# Patient Record
Sex: Female | Born: 1995 | Race: White | Hispanic: No | Marital: Single | Smoking: Current every day smoker
Health system: Southern US, Community
[De-identification: ages and names within clinical notes are randomized; demographics above are authoritative.]

## PROBLEM LIST (undated history)

## (undated) DIAGNOSIS — E109 Type 1 diabetes mellitus without complications: Secondary | ICD-10-CM

## (undated) DIAGNOSIS — F329 Major depressive disorder, single episode, unspecified: Secondary | ICD-10-CM

## (undated) DIAGNOSIS — Q513 Bicornate uterus: Secondary | ICD-10-CM

## (undated) DIAGNOSIS — M329 Systemic lupus erythematosus, unspecified: Secondary | ICD-10-CM

## (undated) DIAGNOSIS — K589 Irritable bowel syndrome without diarrhea: Secondary | ICD-10-CM

## (undated) DIAGNOSIS — R102 Pelvic and perineal pain: Secondary | ICD-10-CM

## (undated) DIAGNOSIS — IMO0002 Reserved for concepts with insufficient information to code with codable children: Secondary | ICD-10-CM

## (undated) DIAGNOSIS — F32A Depression, unspecified: Secondary | ICD-10-CM

## (undated) DIAGNOSIS — B1920 Unspecified viral hepatitis C without hepatic coma: Secondary | ICD-10-CM

## (undated) DIAGNOSIS — J45909 Unspecified asthma, uncomplicated: Secondary | ICD-10-CM

## (undated) DIAGNOSIS — G8929 Other chronic pain: Secondary | ICD-10-CM

## (undated) HISTORY — PX: TONSILLECTOMY: SUR1361

## (undated) HISTORY — DX: Systemic lupus erythematosus, unspecified: M32.9

## (undated) HISTORY — DX: Type 1 diabetes mellitus without complications: E10.9

## (undated) HISTORY — DX: Reserved for concepts with insufficient information to code with codable children: IMO0002

---
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
## 2013-07-02 ENCOUNTER — Emergency Department (HOSPITAL_COMMUNITY): Payer: Medicaid Other

## 2013-07-02 ENCOUNTER — Encounter (HOSPITAL_COMMUNITY): Payer: Self-pay | Admitting: Emergency Medicine

## 2013-07-02 ENCOUNTER — Emergency Department (HOSPITAL_COMMUNITY)
Admission: EM | Admit: 2013-07-02 | Discharge: 2013-07-02 | Disposition: A | Discharge status: Home / Self Care | Payer: Medicaid Other | Attending: Emergency Medicine | Admitting: Emergency Medicine

## 2013-07-02 DIAGNOSIS — Z791 Long term (current) use of non-steroidal anti-inflammatories (NSAID): Secondary | ICD-10-CM | POA: Insufficient documentation

## 2013-07-02 DIAGNOSIS — L0201 Cutaneous abscess of face: Secondary | ICD-10-CM | POA: Insufficient documentation

## 2013-07-02 DIAGNOSIS — K089 Disorder of teeth and supporting structures, unspecified: Secondary | ICD-10-CM | POA: Insufficient documentation

## 2013-07-02 DIAGNOSIS — H9209 Otalgia, unspecified ear: Secondary | ICD-10-CM | POA: Insufficient documentation

## 2013-07-02 DIAGNOSIS — J45909 Unspecified asthma, uncomplicated: Secondary | ICD-10-CM | POA: Insufficient documentation

## 2013-07-02 DIAGNOSIS — Q51820 Cervical duplication: Secondary | ICD-10-CM | POA: Insufficient documentation

## 2013-07-02 DIAGNOSIS — Z792 Long term (current) use of antibiotics: Secondary | ICD-10-CM | POA: Insufficient documentation

## 2013-07-02 DIAGNOSIS — F17200 Nicotine dependence, unspecified, uncomplicated: Secondary | ICD-10-CM | POA: Insufficient documentation

## 2013-07-02 DIAGNOSIS — Z880 Allergy status to penicillin: Secondary | ICD-10-CM | POA: Insufficient documentation

## 2013-07-02 DIAGNOSIS — Z8659 Personal history of other mental and behavioral disorders: Secondary | ICD-10-CM | POA: Insufficient documentation

## 2013-07-02 DIAGNOSIS — L03211 Cellulitis of face: Secondary | ICD-10-CM

## 2013-07-02 LAB — CBC WITH DIFFERENTIAL/PLATELET
Basophils Absolute: 0.1 10*3/uL (ref 0.0–0.1)
Basophils Relative: 1 % (ref 0–1)
EOS ABS: 0.6 10*3/uL (ref 0.0–0.7)
Eosinophils Relative: 5 % (ref 0–5)
HEMATOCRIT: 41.6 % (ref 36.0–46.0)
Hemoglobin: 14.3 g/dL (ref 12.0–15.0)
LYMPHS ABS: 3 10*3/uL (ref 0.7–4.0)
LYMPHS PCT: 24 % (ref 12–46)
MCH: 29.2 pg (ref 26.0–34.0)
MCHC: 34.4 g/dL (ref 30.0–36.0)
MCV: 84.9 fL (ref 78.0–100.0)
MONO ABS: 0.8 10*3/uL (ref 0.1–1.0)
Monocytes Relative: 7 % (ref 3–12)
Neutro Abs: 8 10*3/uL — ABNORMAL HIGH (ref 1.7–7.7)
Neutrophils Relative %: 63 % (ref 43–77)
PLATELETS: 218 10*3/uL (ref 150–400)
RBC: 4.9 MIL/uL (ref 3.87–5.11)
RDW: 13.2 % (ref 11.5–15.5)
WBC: 12.5 10*3/uL — AB (ref 4.0–10.5)

## 2013-07-02 LAB — BASIC METABOLIC PANEL
BUN: 6 mg/dL (ref 6–23)
CALCIUM: 9.1 mg/dL (ref 8.4–10.5)
CO2: 29 mEq/L (ref 19–32)
Chloride: 98 mEq/L (ref 96–112)
Creatinine, Ser: 0.73 mg/dL (ref 0.50–1.10)
Glucose, Bld: 102 mg/dL — ABNORMAL HIGH (ref 70–99)
POTASSIUM: 4.1 meq/L (ref 3.7–5.3)
SODIUM: 138 meq/L (ref 137–147)

## 2013-07-02 MED ORDER — IOHEXOL 300 MG/ML  SOLN
80.0000 mL | Freq: Once | INTRAMUSCULAR | Status: AC | PRN
  Administered 2013-07-02: 80 mL via INTRAVENOUS

## 2013-07-02 MED ORDER — MORPHINE SULFATE 4 MG/ML IJ SOLN
4.0000 mg | Freq: Once | INTRAMUSCULAR | Status: AC
  Administered 2013-07-02: 4 mg via INTRAVENOUS
  Filled 2013-07-02: qty 1

## 2013-07-02 MED ORDER — SODIUM CHLORIDE 0.9 % IJ SOLN
INTRAMUSCULAR | Status: AC
  Filled 2013-07-02: qty 750

## 2013-07-02 MED ORDER — CLINDAMYCIN PHOSPHATE 600 MG/50ML IV SOLN
600.0000 mg | Freq: Once | INTRAVENOUS | Status: AC
  Administered 2013-07-02: 600 mg via INTRAVENOUS
  Filled 2013-07-02: qty 50

## 2013-07-02 MED ORDER — OXYCODONE-ACETAMINOPHEN 5-325 MG PO TABS: 1.0000 | ORAL_TABLET | ORAL | Status: DC | PRN

## 2013-07-02 MED ORDER — CLINDAMYCIN HCL 300 MG PO CAPS: 300.0000 mg | ORAL_CAPSULE | Freq: Four times a day (QID) | ORAL | Status: DC

## 2013-07-02 NOTE — ED Notes (Signed)
Pt c/o pain and facial swelling that started three days ago, [place] had "numbing" shot and was given PCN 500 mg BID, pt states that the swelling has become worse, pain with swallowing, eating. Pt has swelling noted from right eye to bottom of right face area.

## 2013-07-02 NOTE — Discharge Instructions (Signed)
Cellulitis °Cellulitis is an infection of the skin and the tissue under the skin. The infected area is usually red and tender. This happens most often in the arms and lower legs. °HOME CARE  °· Take your antibiotic medicine as told. Finish the medicine even if you start to feel better. °· Keep the infected arm or leg raised (elevated). °· Put a warm cloth on the area up to 4 times per day. °· Only take medicines as told by your doctor. °· Keep all doctor visits as told. °GET HELP RIGHT AWAY IF:  °· You have a fever. °· You feel very sleepy. °· You throw up (vomit) or have watery poop (diarrhea). °· You feel sick and have muscle aches and pains. °· You see red streaks on the skin coming from the infected area. °· Your red area gets bigger or turns a dark color. °· Your bone or joint under the infected area is painful after the skin heals. °· Your infection comes back in the same area or different area. °· You have a puffy (swollen) bump in the infected area. °· You have new symptoms. °MAKE SURE YOU:  °· Understand these instructions. °· Will watch your condition. °· Will get help right away if you are not doing well or get worse. °Document Released: 06/08/2007 Document Revised: 06/21/2011 Document Reviewed: 03/07/2011 °[employer]® Patient Information ©2015 [employer]. This information is not intended to replace advice given to you by your health care provider. Make sure you discuss any questions you have with your health care provider. ° °

## 2013-07-02 NOTE — ED Notes (Signed)
Pt requesting to see Dr Bebe ShaggyWickline again, Dr Bebe ShaggyWickline notified,

## 2013-07-02 NOTE — ED Provider Notes (Signed)
CSN: [id number]     Arrival date & time 07/02/13  1059 History  This chart was scribed for Linda Gaskinsonald W Aden Youngman, MD by Julian HyMorgan Graham, ED Scribe. The patient was seen in [facility]. The patient's care was started at 12:18 PM.    Chief Complaint  Patient presents with  . Facial Swelling   Patient is a 18 y.o. female presenting with general illness. The history is provided by the patient. No language interpreter was used.  Illness Location:  Right side facial swelling Severity:  Moderate Onset quality:  Sudden Duration:  2 days Timing:  Constant Progression:  Worsening Chronicity:  New Context:  Was seen prior to today and given penicillin Ineffective treatments:  Penicillin Associated symptoms: ear pain (right side) and fever   Associated symptoms: no vomiting   Associated symptoms comment:  Pt states throat and left nasal passage swelling.   HPI Comments: Linda Spence is a 18 y.o. female who presents to the Emergency Department complaining of right-sided facial swelling that started 2-3 days ago. The pt states she has swelling and pain by her right eye, right cheek, and right ear. Pt states she [facility], and received 3 shots of numbing medication in her gums and penicillin orally. Pt states her pain immediately decreased, but her swelling increased the next day. She has a listed allergy to Amoxicillin in her chart. Pt states she has fever, difficulty swallowing, throat swelling, and left nasal passage swelling. Pt states she has difficulty seeing out of her right eye due to swelling. Pt denies history of diabetes, but has a family history of diabetes.   Past Medical History  Diagnosis Date  . Asthma as a child  . Depression   . Double cervix    Past Surgical History  Procedure Laterality Date  . Vaginal septum resection  06/15/2010    Linda Spence -- [name], normal uterine contour external  . Uterine septum resection  08/2010    [facility]   2 cervices, nl ut ext contours  . Tonsillectomy    . Perineal laceration repair  09/23/2011    Procedure: SUTURE REPAIR PERINEAL LACERATION;  Surgeon: Tilda BurrowJohn V Ferguson, MD;  Location: [facility];  Service: Gynecology;  Laterality: N/A;  Repair Obstetric Laceration   History reviewed. No pertinent family history. History  Substance Use Topics  . Smoking status: Current Every Day Smoker -- 0.50 packs/day for 8 years    Types: Cigarettes  . Smokeless tobacco: Never Used  . Alcohol Use: No   OB History   Grav Para Term Preterm Abortions TAB SAB Ect Mult Living   3 1 1  1  1   1      Obstetric Comments   Septate uterus, with 2 cervices, normal external uterine contours, s/p resection of uterine longitudinal septum 08/2010, and s/p resection of longitudinal vaginal septum 06/15/2010 Cervix length of RIGHT cervix 3.3 cm on 05/09/2011     Review of Systems  Constitutional: Positive for fever.  HENT: Positive for dental problem, ear pain (right side), facial swelling (right side) and trouble swallowing.        Positive left nasal passage swelling. Positive throat swelling.   Eyes: Positive for pain (Right). Negative for visual disturbance.  Gastrointestinal: Negative for vomiting.  All other systems reviewed and are negative.   Allergies  Amoxicillin and Onion  Home Medications   Prior to Admission medications   Medication Sig Start Date End Date Taking? Authorizing Provider  HYDROcodone-acetaminophen (NORCO) 10-325 MG per tablet Take 1 tablet by mouth every 6 (six) hours as needed for moderate pain.   Yes Historical Provider, MD  ibuprofen (ADVIL,MOTRIN) 800 MG tablet Take 800 mg by mouth every 6 (six) hours as needed for moderate pain.   Yes Historical Provider, MD  penicillin v potassium (VEETID) 500 MG tablet Take 500 mg by mouth 2 (two) times daily. 06/29/13 07/09/13 Yes Historical Provider, MD   Triage Vitals: BP 129/81  Pulse 95  Temp(Src) 98.3 F (36.8 C) (Oral)  Resp 18  Ht 5\' 3"   (1.6 m)  Wt 125 lb (56.7 kg)  BMI 22.15 kg/m2  SpO2 96%  LMP 06/26/2013  Physical Exam  Nursing note and vitals reviewed. CONSTITUTIONAL: Well developed/well nourished HEAD: Normocephalic/atraumatic EYES: EOMI/PERRL, no proptosis ENMT: Mucous membranes moist, no trismus, right facial swelling noted, tenderness to right upper molars, no facial crepitus NECK: supple no meningeal signs CV: S1/S2 noted, no murmurs/rubs/gallops noted LUNGS: Lungs are clear to auscultation bilaterally, no apparent distress ABDOMEN: soft, nontender, no rebound or guarding GU:no cva tenderness NEURO: Pt is awake/alert, moves all extremitiesx4 EXTREMITIES: pulses normal, full ROM SKIN: warm, color normal PSYCH: no abnormalities of mood noted   ED Course  Procedures DIAGNOSTIC STUDIES: Oxygen Saturation is 96% on room air, adequate by my interpretation.    COORDINATION OF CARE: 12:23 PM- Will order CT Scan of face, pain medication. Patient informed of current plan for treatment and evaluation and agrees with plan at this time.  2:46 PM Ct shows likely facial cellulitis possible from odontogenic origin Will start on clindamycin and advised dental followup Pt is stable and appropriate for outpatient management  Labs Review Labs Reviewed  BASIC METABOLIC PANEL - Abnormal; Notable for the following:    Glucose, Bld 102 (*)    All other components within normal limits  CBC WITH DIFFERENTIAL - Abnormal; Notable for the following:    WBC 12.5 (*)    Neutro Abs 8.0 (*)    All other components within normal limits    Imaging Review Ct Maxillofacial W/cm  07/02/2013   CLINICAL DATA:  FACIAL SWELLING  EXAM: CT MAXILLOFACIAL WITH CONTRAST  TECHNIQUE: Multidetector CT imaging of the maxillofacial structures was performed with intravenous contrast. Multiplanar CT image reconstructions were also generated. A small metallic BB was placed on the right temple in order to reliably differentiate right from left.   CONTRAST:  80mL OMNIPAQUE IOHEXOL 300 MG/ML  SOLN  COMPARISON:  None.  FINDINGS: The visualized portions of the brain are unremarkable.  Orbits are within normal limits.  There is asymmetric swelling with inflammatory soft tissue stranding within the right face, lateral to the right mandible. Findings are consistent with cellulitis. Prominent dental caries present within the second right mandibular molar, suggesting an odontogenic origin. No loculated or rim enhancing collection identified to suggest abscess. Reactive thickening and stranding seen about the right parotid duct. The right parotid gland itself is within normal limits.  Oral cavity within normal limits. Oropharynx and nasopharynx are unremarkable.  Submandibular glands are normal.  Nodes identified within the partially visualized neck.  Mild mucoperiosteal thickening present within the right maxillary sinus. Paranasal sinuses are otherwise clear. No mastoid effusion.  IMPRESSION: Soft tissue swelling with inflammatory stranding within the right face, compatible with cellulitis. A prominent dental carie present within the second right mandibular molar suggests an odontogenic origin. No loculated fluid collection to suggest abscess.   Electronically Signed   By: Janell QuietBenjamin  McClintock M.D.  On: 07/02/2013 13:18      MDM   Final diagnoses:  Facial cellulitis    Nursing notes including past medical history and social history reviewed and considered in documentation Labs/vital reviewed and considered   I personally performed the services described in this documentation, which was scribed in my presence. The recorded information has been reviewed and is accurate.      Linda Gaskins, MD 07/02/13 [phone]

## 2013-07-02 NOTE — ED Notes (Signed)
Pt has swelling rt side of face x 3 days, [facility] [place] and given Penicillin. Within 24 hours pt states the whole rt side of face has swollen and has a headache, also co swelling into neck and soft tissues.

## 2013-08-18 ENCOUNTER — Encounter (HOSPITAL_COMMUNITY): Payer: Self-pay | Admitting: Emergency Medicine

## 2013-08-18 ENCOUNTER — Emergency Department (HOSPITAL_COMMUNITY)
Admission: EM | Admit: 2013-08-18 | Discharge: 2013-08-18 | Disposition: A | Discharge status: Home / Self Care | Payer: Medicaid Other | Attending: Emergency Medicine | Admitting: Emergency Medicine

## 2013-08-18 DIAGNOSIS — K089 Disorder of teeth and supporting structures, unspecified: Secondary | ICD-10-CM | POA: Diagnosis not present

## 2013-08-18 DIAGNOSIS — J45909 Unspecified asthma, uncomplicated: Secondary | ICD-10-CM | POA: Diagnosis not present

## 2013-08-18 DIAGNOSIS — F17200 Nicotine dependence, unspecified, uncomplicated: Secondary | ICD-10-CM | POA: Diagnosis not present

## 2013-08-18 DIAGNOSIS — Q51820 Cervical duplication: Secondary | ICD-10-CM | POA: Insufficient documentation

## 2013-08-18 DIAGNOSIS — Z791 Long term (current) use of non-steroidal anti-inflammatories (NSAID): Secondary | ICD-10-CM | POA: Insufficient documentation

## 2013-08-18 DIAGNOSIS — Z8659 Personal history of other mental and behavioral disorders: Secondary | ICD-10-CM | POA: Diagnosis not present

## 2013-08-18 DIAGNOSIS — Z880 Allergy status to penicillin: Secondary | ICD-10-CM | POA: Diagnosis not present

## 2013-08-18 DIAGNOSIS — K029 Dental caries, unspecified: Secondary | ICD-10-CM | POA: Insufficient documentation

## 2013-08-18 DIAGNOSIS — K0889 Other specified disorders of teeth and supporting structures: Secondary | ICD-10-CM

## 2013-08-18 MED ORDER — CLINDAMYCIN HCL 150 MG PO CAPS: 450.0000 mg | ORAL_CAPSULE | Freq: Three times a day (TID) | ORAL | Status: DC

## 2013-08-18 MED ORDER — BUPIVACAINE-EPINEPHRINE (PF) 0.5% -1:200000 IJ SOLN: 1.8000 mL | Freq: Once | INTRAMUSCULAR | Status: DC

## 2013-08-18 MED ORDER — BUPIVACAINE-EPINEPHRINE (PF) 0.5% -1:200000 IJ SOLN
INTRAMUSCULAR | Status: AC
  Filled 2013-08-18: qty 30

## 2013-08-18 MED ORDER — HYDROCODONE-ACETAMINOPHEN 5-325 MG PO TABS: ORAL_TABLET | ORAL | Status: DC

## 2013-08-18 NOTE — Discharge Instructions (Signed)
Take vicodin for breakthrough pain, do not drink alcohol, drive, care for children or do other critical tasks while taking vicodin. ° °Return to the emergency room for fever, change in vision, redness to the face that rapidly spreads towards the eye, nausea or vomiting, difficulty swallowing or shortness of breath. °  °Apply warm compresses to jaw throughout the day.  ° °Take your antibiotics as directed and to the end of the course.  ° °Followup with a dentist is very important for ongoing evaluation and management of recurrent dental pain. Return to emergency department for emergent changing or worsening symptoms." ° °Low-cost dental clinic: °**David  Civils  at [phone]**  °**Janna Civils at [phone] [address]**   ° °You may also call [phone] ° °Dental Assistance °If the dentist on-call cannot see you, please use the resources below: ° ° °Patients with Medicaid: [facility] °[address], [phone] °[address], [phone] ° °If unable to pay, or uninsured, contact [facility] ([phone]) or [facility] ([phone] in [place], [phone] in [place]) to become qualified for the adult dental clinic ° °Other Low-Cost Community Dental Services: °[facility]- [address], [place], [place], [address] °   [phone], Ext. [phone] °   2nd and 4th Thursday of the month at 6:30am °   10 clients each day by appointment, can sometimes see walk-in     patients if someone does not show for an appointment °[facility]- [address], [place], [place], [address] °   [phone] °[facility]- [address], [place], [place], [address] °   [phone] ° °[facility]- [phone] °[facility]- [phone] °[facility]- [phone] ° °

## 2013-08-18 NOTE — ED Provider Notes (Signed)
CSN: [id number]     Arrival date & time 08/18/13  1944 History   First MD Initiated Contact with Patient 08/18/13 1957     Chief Complaint  Patient presents with  . Dental Pain     (Consider location/radiation/quality/duration/timing/severity/associated sxs/prior Treatment) HPI  Bufford LopeMargaret E Hiley is a 18 y.o. female complaining of dental pain increasing since she had her bilateral wisdom teeth removed several days ago. Patient has chronic dental pain. She states that the worst pain is in the right mandibular area. Is taking ibuprofen at home with little relief. Denies fever/chills, difficulty opening jaw, difficulty swallowing, SOB, gum swelling, facial swelling, neck swelling.   Past Medical History  Diagnosis Date  . Asthma as a child  . Depression   . Double cervix    Past Surgical History  Procedure Laterality Date  . Vaginal septum resection  06/15/2010    [name] -- [name], normal uterine contour external  . Uterine septum resection  08/2010    [facility]  2 cervices, nl ut ext contours  . Tonsillectomy    . Perineal laceration repair  09/23/2011    Procedure: SUTURE REPAIR PERINEAL LACERATION;  Surgeon: Tilda BurrowJohn V Ferguson, MD;  Location: [facility];  Service: Gynecology;  Laterality: N/A;  Repair Obstetric Laceration   History reviewed. No pertinent family history. History  Substance Use Topics  . Smoking status: Current Every Day Smoker -- 0.50 packs/day for 8 years    Types: Cigarettes  . Smokeless tobacco: Never Used  . Alcohol Use: No   OB History   Grav Para Term Preterm Abortions TAB SAB Ect Mult Living   3 1 1  1  1   1      Obstetric Comments   Septate uterus, with 2 cervices, normal external uterine contours, s/p resection of uterine longitudinal septum 08/2010, and s/p resection of longitudinal vaginal septum 06/15/2010 Cervix length of RIGHT cervix 3.3 cm on 05/09/2011     Review of Systems  10 systems reviewed and found to be negative, except as noted  in the HPI.    Allergies  Amoxicillin and Onion  Home Medications   Prior to Admission medications   Medication Sig Start Date End Date Taking? Authorizing Provider  HYDROcodone-acetaminophen (NORCO) 7.5-325 MG per tablet Take 1 tablet by mouth every 6 (six) hours as needed (pain).   Yes Historical Provider, MD  ibuprofen (ADVIL,MOTRIN) 800 MG tablet Take 800 mg by mouth every 6 (six) hours as needed for moderate pain.   Yes Historical Provider, MD  clindamycin (CLEOCIN) 150 MG capsule Take 3 capsules (450 mg total) by mouth 3 (three) times daily. 08/18/13   Nayelis Bonito, PA-C  HYDROcodone-acetaminophen (NORCO/VICODIN) 5-325 MG per tablet Take 1-2 tablets by mouth every 6 hours as needed for pain. 08/18/13   Elaria Osias, PA-C   BP 129/82  Pulse 74  Temp(Src) 98.8 F (37.1 C) (Oral)  Resp 18  Ht 5\' 3"  (1.6 m)  Wt 125 lb (56.7 kg)  BMI 22.15 kg/m2  SpO2 100%  LMP 07/17/2013 Physical Exam  Nursing note and vitals reviewed. Constitutional: She is oriented to person, place, and time. She appears well-developed and well-nourished. No distress.  HENT:  Head: Normocephalic.  Mouth/Throat: Uvula is midline and oropharynx is clear and moist. No trismus in the jaw. No uvula swelling. No oropharyngeal exudate, posterior oropharyngeal edema, posterior oropharyngeal erythema or tonsillar abscesses.  Generally poor dentition, no gingival swelling, erythema or tenderness to palpation. Patient is handling their secretions.  There is no tenderness to palpation or firmness underneath tongue bilaterally. No trismus.    Eyes: Conjunctivae and EOM are normal.  Cardiovascular: Normal rate, regular rhythm and intact distal pulses.   Pulmonary/Chest: Effort normal. No stridor.  Musculoskeletal: Normal range of motion.  Lymphadenopathy:    She has no cervical adenopathy.  Neurological: She is alert and oriented to person, place, and time.  Psychiatric: She has a normal mood and affect.    ED  Course  NERVE BLOCK Date/Time: 08/18/2013 9:08 PM Performed by: Wynetta Emery Authorized by: Wynetta Emery Consent: Verbal consent obtained. Consent given by: patient Patient identity confirmed: verbally with patient Indications: pain relief Body area: face/mouth Nerve: inferior alveolar Laterality: left Patient sedated: no Patient position: sitting Needle gauge: 27 G Location technique: anatomical landmarks Local anesthetic: bupivacaine 0.5% with epinephrine Anesthetic total: 1.8 ml Outcome: pain improved Patient tolerance: Patient tolerated the procedure well with no immediate complications.   (including critical care time) Labs Review Labs Reviewed - No data to display  Imaging Review No results found.   EKG Interpretation None      MDM   Final diagnoses:  Pain, dental    Filed Vitals:   08/18/13 1950  BP: 129/82  Pulse: 74  Temp: 98.8 F (37.1 C)  TempSrc: Oral  Resp: 18  Height: 5\' 3"  (1.6 m)  Weight: 125 lb (56.7 kg)  SpO2: 100%    Medications  bupivacaine-epinephrine (MARCAINE W/ EPI) 0.5% -1:200000 injection 1.8 mL (not administered)  bupivacaine-epinephrine (MARCAINE W/ EPI) 0.5% -1:200000 injection (not administered)    HILDEGARDE DUNAWAY is a 18 y.o. female presenting with dental pain associated with dental caries but no signs or symptoms of dental abscess. Patient afebrile, non toxic appearing and swallowing secretions well. I gave patient referral to dentist and stressed the importance of dental follow up for definitive management of dental issues. Patient voices understanding and is agreeable to plan.  Evaluation does not show pathology that would require ongoing emergent intervention or inpatient treatment. Pt is hemodynamically stable and mentating appropriately. Discussed findings and plan with patient/guardian, who agrees with care plan. All questions answered. Return precautions discussed and outpatient follow up given.   New  Prescriptions   CLINDAMYCIN (CLEOCIN) 150 MG CAPSULE    Take 3 capsules (450 mg total) by mouth 3 (three) times daily.   HYDROCODONE-ACETAMINOPHEN (NORCO/VICODIN) 5-325 MG PER TABLET    Take 1-2 tablets by mouth every 6 hours as needed for pain.         Wynetta Emery, PA-C 08/18/13 2109

## 2013-08-18 NOTE — ED Notes (Signed)
Recent wisdom teeth removed couple days ago, dental pain for 2 months

## 2013-08-19 NOTE — ED Provider Notes (Signed)
Medical screening examination/treatment/procedure(s) were performed by non-physician practitioner and as supervising physician I was immediately available for consultation/collaboration.   EKG Interpretation None        Evia Goldsmith, DO 08/19/13 1548 

## 2013-11-04 ENCOUNTER — Encounter (HOSPITAL_COMMUNITY): Payer: Self-pay | Admitting: Emergency Medicine

## 2014-03-03 ENCOUNTER — Ambulatory Visit: Payer: Self-pay | Admitting: Adult Health

## 2014-03-03 ENCOUNTER — Encounter: Payer: Self-pay | Admitting: *Deleted

## 2014-04-04 ENCOUNTER — Encounter: Payer: Self-pay | Admitting: Obstetrics and Gynecology

## 2014-04-04 ENCOUNTER — Ambulatory Visit (INDEPENDENT_AMBULATORY_CARE_PROVIDER_SITE_OTHER): Payer: Medicaid Other | Admitting: Obstetrics and Gynecology

## 2014-04-04 VITALS — BP 120/80 | Ht 63.0 in | Wt 141.0 lb

## 2014-04-04 DIAGNOSIS — R102 Pelvic and perineal pain: Secondary | ICD-10-CM

## 2014-04-04 MED ORDER — TRAMADOL HCL 50 MG PO TABS: 50.0000 mg | ORAL_TABLET | Freq: Four times a day (QID) | ORAL | Status: DC | PRN

## 2014-04-04 MED ORDER — MEGESTROL ACETATE 40 MG PO TABS: 40.0000 mg | ORAL_TABLET | Freq: Three times a day (TID) | ORAL | Status: DC

## 2014-04-04 NOTE — Progress Notes (Signed)
Patient ID: Bufford LopeMargaret E Spence, female   DOB: 12/19/1995, 19 y.o.   MRN: [id number] Pt states that she is here today for follow up on bleeding and pain that she has been having. Pt states that she was supposed to have surgery and lost her insurance and was unable to have the surgery. Pt states that she has noticed a knot in her stomach that has progressively gotten bigger.

## 2014-04-04 NOTE — Progress Notes (Signed)
Patient ID: Linda Spence, female   DOB: 10/26/1995, 19 y.o.   MRN: [id number]    [facility]  Patient name: Linda Spence MRN [id number]  Date of birth: 03/25/1995  CC & HPI:  Linda Spence is a 19 y.o. female s/p vaginal septum removal and uterine septum resection presenting today for continued abdominal pain and vaginal pain that started 1 year ago. Pt reports vaginal bleeding that started 3 weeks ago, a mass in her lower abdomen that has gotten larger in the last few months, pain with BM and recurrent constipation. Her pain becomes worse with entering and penetration during intercourse. Pt has tried laxatives and Tylenol with no relief. Pt reports tears that occurred during delivery that were scheduled to be surgically repaired. She lost her Medicaid prior to the surgery and did not have the procedure done.  ROS:  A complete 10 system review of systems was obtained and all systems are negative except as noted in the HPI and PMH.  Pt with a very + ROS , she notices "fluttering " in llq, daily  Pertinent History Reviewed:   Reviewed: Significant for vaginal septum resection and uterine septum resection  Medical         Past Medical History  Diagnosis Date   Asthma as a child   Depression    Double cervix                               Surgical Hx:    Past Surgical History  Procedure Laterality Date   Vaginal septum resection  06/15/2010    [name] -- [name], normal uterine contour external   Uterine septum resection  08/2010    [facility]  2 cervices, nl ut ext contours   Tonsillectomy     Perineal laceration repair  09/23/2011    Procedure: SUTURE REPAIR PERINEAL LACERATION;  Surgeon: Tilda BurrowJohn V Ferguson, MD;  Location: [facility];  Service: Gynecology;  Laterality: N/A;  Repair Obstetric Laceration   Medications: Reviewed & Updated - see associated section                      No current outpatient prescriptions on file.   Social History:  Reviewed -  reports that she has been smoking Cigarettes.  She has a 2.5 pack-year smoking history. She has never used smokeless tobacco.  Objective Findings:  Vitals: Blood pressure 120/80, height 5\' 3"  (1.6 m), weight 141 lb (63.957 kg), last menstrual period 03/14/2014, not currently breastfeeding.  Physical Examination: General appearance - alert, well appearing, and in no distress and oriented to person, place, and time Mental status - alert, oriented to person, place, and time, normal mood, behavior, speech, dress, motor activity, and thought processes Pelvic - normal external genitalia, vulva, vagina, cervix, uterus and adnexa VULVA: normal appearing vulva with no masses, tenderness or lesions VAGINA: normal appearing vagina with normal color and discharge, no lesions, good levator plate muscles, unusually heavy vaginal bleeding CERVIX: normal appearing cervix without discharge or lesions UTERUS: uterus is normal size, shape, consistency and nontender ADNEXA: normal adnexa in size, nontender and no masses   Assessment & Plan:   A: s/p vaginal septum removal,     S.p uterine septum removal     Cervical eversion,S/p obstetric laceration of cervix at delivery 2014     S/p double cervix.    Low pain tolerance by  pt hx. 1. Unusually heavy vaginal bleeding that started 3 weeks ago, AUB 2. Dyspareunia   P:  1. Follow-up in 1 week; [place] of pelvis to detect abnormalities 2. Megace for 15 days to control bleeding 3. Tramadol trial for discomfort. Will avoid opiates, and cautiously use analagesics.   This chart was scribed for Tilda Burrow, MD by Gwenyth Ober, ED Scribe. This patient was seen in room 2 and the patient's care was started at 12:15 PM.   I personally performed the services described in this documentation, which was SCRIBED in my presence. The recorded information has been reviewed and considered accurate. It has been edited as necessary during review. Tilda Burrow,  MD

## 2014-04-08 ENCOUNTER — Telehealth: Payer: Self-pay | Admitting: *Deleted

## 2014-04-08 NOTE — Telephone Encounter (Signed)
Pt was given megace and since she started taking the medication she has noticed her right "butt cheek" all the way down her right thigh. Pt states that she only took the medication once yesterday and none today. Pt states that she has had the numbness for about four days. Pt stated that she just wanted us to know that she has stopped taking the medication.

## 2014-04-18 ENCOUNTER — Ambulatory Visit (INDEPENDENT_AMBULATORY_CARE_PROVIDER_SITE_OTHER): Payer: Medicaid Other

## 2014-04-18 ENCOUNTER — Other Ambulatory Visit: Payer: Self-pay | Admitting: Obstetrics and Gynecology

## 2014-04-18 ENCOUNTER — Encounter: Payer: Self-pay | Admitting: Obstetrics and Gynecology

## 2014-04-18 ENCOUNTER — Ambulatory Visit (INDEPENDENT_AMBULATORY_CARE_PROVIDER_SITE_OTHER): Payer: Medicaid Other | Admitting: Obstetrics and Gynecology

## 2014-04-18 VITALS — BP 110/60 | Ht 63.0 in | Wt 142.5 lb

## 2014-04-18 DIAGNOSIS — N939 Abnormal uterine and vaginal bleeding, unspecified: Secondary | ICD-10-CM

## 2014-04-18 DIAGNOSIS — N949 Unspecified condition associated with female genital organs and menstrual cycle: Secondary | ICD-10-CM | POA: Diagnosis not present

## 2014-04-18 DIAGNOSIS — R102 Pelvic and perineal pain: Secondary | ICD-10-CM

## 2014-04-18 DIAGNOSIS — G8929 Other chronic pain: Secondary | ICD-10-CM

## 2014-04-18 MED ORDER — GABAPENTIN 300 MG PO CAPS: 300.0000 mg | ORAL_CAPSULE | Freq: Three times a day (TID) | ORAL | Status: DC

## 2014-04-18 NOTE — Progress Notes (Signed)
US pelvis,anteverted uterus wnl,eec 2.526mm,normal ov's bilat,no free fluid seen

## 2014-04-18 NOTE — Progress Notes (Signed)
Patient ID: Linda Spence, female   DOB: 04/17/1995, 19 y.o.   MRN: [id number] Pt here today for follow up. Pt had [place] and then is to discuss results with Dr. Emelda Fear. Pt states that she stopped taking the megace because she had two of the side effects of the medication, numbness in her right " butt cheek " that radiates down her upper leg and she could not have an orgasm. Pt was very worried this came from the megace after she researched the medication. Pt states that she is still having the pain and bleeding. Pt states that the bleeding has gotten worse since her last visit. Pt states that she has to change her twice in an hour and they are the heavy flow pads.    [facility] Visit  Patient name: Linda Spence MRN [id number]  Date of birth: 1995-09-05  CC & HPI:  Linda Spence is a 19 y.o. female presenting today for Several concerns"  Hx AUB for whic pt  Was rx'd megace, but she stopped it due to some buttock numbness that occurred on 2nd day of tx, and has persisted despite d/c of megace, with no  Neurologic or thromboembolic concerns. Pelvic u/s done to day to r/o abnromalities shows a totally normal appearing uterus.    ROS:  See above notes   Pertinent History Reviewed:   Reviewed: Significant for pt is S/P uterine septum removal, vaginal septum removal , and pt had  2 CERVICAL ORIFICES that tore during labor to become one cervical os with an everted endocervix.  It is unsure whether the pt would benefit from cerclage with future preganacies. Medical         Past Medical History  Diagnosis Date  . Asthma as a child  . Depression   . Double cervix                               Surgical Hx:    Past Surgical History  Procedure Laterality Date  . Vaginal septum resection  06/15/2010    [name], normal uterine contour external  . Uterine septum resection  08/2010    [facility]  2 cervices, nl ut ext contours  . Tonsillectomy    . Perineal  laceration repair  09/23/2011    Procedure: SUTURE REPAIR PERINEAL LACERATION;  Surgeon: Tilda Burrow, MD;  Location: [facility];  Service: Gynecology;  Laterality: N/A;  Repair Obstetric Laceration   Medications: Reviewed & Updated - see associated section                       Current outpatient prescriptions:  .  traMADol (ULTRAM) 50 MG tablet, Take 1 tablet (50 mg total) by mouth every 6 (six) hours as needed., Disp: 60 tablet, Rfl: 0 .  megestrol (MEGACE) 40 MG tablet, Take 1 tablet (40 mg total) by mouth 3 (three) times daily. (Patient not taking: Reported on 04/18/2014), Disp: 45 tablet, Rfl: 2   Social History: Reviewed -  reports that she has been smoking Cigarettes.  She has a 2.5 pack-year smoking history. She has never used smokeless tobacco.  Objective Findings:  Vitals: Blood pressure 110/60, height  (1.6 m), weight 142 lb 8 oz (64.638 kg), last menstrual period 03/14/2014.  Physical Examination: General appearance - alert, well appearing, and in no distress and oriented to person, place, and time anxious  Mental status - alert, oriented to person, place, and time, normal mood, behavior, speech, dress, motor activity, and thought processes, anxious Abdomen - soft, nontender, nondistended, no masses or organomegaly Pelvic - VULVA: normal appearing vulva with no masses, tenderness or lesions, VAGINA: normal appearing vagina with normal color and discharge, no lesions, CERVIX: normal appearing cervix without discharge or lesions, ectropion , everted s/p labor changes , UTERUS: tenderness , anteverted, mobile, ADNEXA: normal adnexa in size, nontender and no masses, no palpable internal organs   Assessment & Plan:   A:  1. Hx double cervix os now one, torn at delivery 2. 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 Generalized low pain tolerance. 4. Normal pelvic u/s after removal of vaginal and uterine septum 5. Hx hip discomfort felt unrelated to pt use of  Megace 6. AUB, will restart megace  P:  1. Restart megace  2. Clinical test of neurontin to see if pt pain symptoms abate. 3 pt requests referral to dermatologist for worry of autoimmune disease no referral req'd

## 2014-05-16 ENCOUNTER — Encounter: Payer: Self-pay | Admitting: Obstetrics and Gynecology

## 2014-05-16 ENCOUNTER — Ambulatory Visit: Payer: Medicaid Other | Admitting: Obstetrics and Gynecology

## 2014-11-19 ENCOUNTER — Ambulatory Visit (INDEPENDENT_AMBULATORY_CARE_PROVIDER_SITE_OTHER): Payer: Medicaid Other | Admitting: Obstetrics and Gynecology

## 2014-11-19 ENCOUNTER — Encounter: Payer: Self-pay | Admitting: Obstetrics and Gynecology

## 2014-11-19 VITALS — BP 120/80 | Ht 64.0 in | Wt 140.0 lb

## 2014-11-19 DIAGNOSIS — N949 Unspecified condition associated with female genital organs and menstrual cycle: Secondary | ICD-10-CM

## 2014-11-19 DIAGNOSIS — R102 Pelvic and perineal pain: Secondary | ICD-10-CM

## 2014-11-19 DIAGNOSIS — N941 Unspecified dyspareunia: Secondary | ICD-10-CM | POA: Diagnosis not present

## 2014-11-19 DIAGNOSIS — G8929 Other chronic pain: Secondary | ICD-10-CM | POA: Insufficient documentation

## 2014-11-19 NOTE — Progress Notes (Signed)
[facility] Visit  Patient name: Linda Spence MRN [id number]  Date of birth: [birth year]  CC & HPI:  Linda Spence is a 19 y.o. female presenting today for progressively worsening, sudden onset, abdominal and vaginal pain over the past 3 months. Pt previously had removal of vaginal septum resection in 2012. She had a double cervix and when she delivered her son it ripped through both of them. Pt is unsure if this pain has to do with her previous surgeries. Pt reports associated dyspareunia and during intercourse she has an instant, sharp pain with only about 4 inches into vaginal entrance. She also states she has pain with bowel movements and it makes it difficult to defecate but states she is able to still have bowel movements. She notes she has not had a period in 6 weeks but her UPT was negative. She is not currently on any medications. She would like to have more babies in the future  ROS:  10 Systems reviewed and all are negative for acute change except as noted in the HPI.   Pertinent History Reviewed:   Reviewed: Significant for double cervix and asthma Medical         Past Medical History  Diagnosis Date  . Asthma as a child  . Depression   . Double cervix                               Surgical Hx:    Past Surgical History  Procedure Laterality Date  . Vaginal septum resection  06/15/2010    [name] -- [name], normal uterine contour external  . Uterine septum resection  08/2010    [facility]  2 cervices, nl ut ext contours  . Tonsillectomy    . Perineal laceration repair  09/23/2011    Procedure: SUTURE REPAIR PERINEAL LACERATION;  Surgeon: Tilda Burrow, MD;  Location: [facility];  Service: Gynecology;  Laterality: N/A;  Repair Obstetric Laceration   Medications: Reviewed & Updated - see associated section                       Current outpatient prescriptions:  .  gabapentin (NEURONTIN) 300 MG capsule, Take 1 capsule (300 mg total) by mouth 3  (three) times daily., Disp: 90 capsule, Rfl: 1   Social History: Reviewed -  reports that she has been smoking Cigarettes.  She has a 2.5 pack-year smoking history. She has never used smokeless tobacco.  Objective Findings:  Vitals: Blood pressure 120/80, height  (1.626 m), weight 140 lb (63.504 kg), last menstrual period 10/08/2014.  Physical Examination: General appearance - alert, well appearing, and in no distress Mental status - alert, oriented to person, place, and time Abdomen - soft, nontender, nondistended, no masses or organomegaly Pelvic -  VAGINA: short vaginal length. normal external genitalia. well healed s/p removal of vaginal septum. slight scarring in anterior vaginal fornix  UTERUS: anterior CERVIX: everted  Neurological - alert, oriented, normal speech, no focal findings or movement disorder noted Musculoskeletal - no joint tenderness, deformity or swelling Extremities - peripheral pulses normal, no pedal edema, no clubbing or cyanosis   Assessment & Plan:   A:  1. General pelvic sensitivty 2. dyspareunia due to uterine contact , short vag length 3. Functional bowel pain  P:  1. Recommended stool softeners 2. NSAIDs before intercourse 3. Follow up yearly on PRN  By signing my name below, I, Jarvis Morganaylor Jelitza Manninen, attest that this documentation has been prepared under the direction and in the presence of Tilda BurrowJohn Kelleen Stolze V, MD. Electronically Signed: Jarvis Morganaylor Ridley Dileo, ED Scribe. 11/19/2014. 9:56 AM.  I personally performed the services described in this documentation, which was SCRIBED in my presence. The recorded information has been reviewed and considered accurate. It has been edited as necessary during review. Tilda BurrowFERGUSON,Joss Friedel V, MD   .

## 2014-11-19 NOTE — Progress Notes (Signed)
Patient ID: Linda LopeMargaret E Puskarich, female   DOB: 11/11/1995, 19 y.o.   MRN: [id number] Pt here today for abdominal and vaginal pain. Pt states that she has had the pain for a few months but it has progressively gotten worse. Pt states that she is not sure if it is her ovaries or where she had her surgeries. Pt has not had a period in 6 weeks, but UPT is negative today.

## 2015-04-09 ENCOUNTER — Emergency Department (HOSPITAL_COMMUNITY): Payer: Medicaid Other

## 2015-04-09 ENCOUNTER — Encounter (HOSPITAL_COMMUNITY): Payer: Self-pay | Admitting: Emergency Medicine

## 2015-04-09 ENCOUNTER — Emergency Department (HOSPITAL_COMMUNITY)
Admission: EM | Admit: 2015-04-09 | Discharge: 2015-04-10 | Disposition: A | Discharge status: Home / Self Care | Payer: Medicaid Other | Attending: Emergency Medicine | Admitting: Emergency Medicine

## 2015-04-09 DIAGNOSIS — R1011 Right upper quadrant pain: Secondary | ICD-10-CM | POA: Diagnosis present

## 2015-04-09 DIAGNOSIS — F17210 Nicotine dependence, cigarettes, uncomplicated: Secondary | ICD-10-CM | POA: Diagnosis not present

## 2015-04-09 DIAGNOSIS — F329 Major depressive disorder, single episode, unspecified: Secondary | ICD-10-CM | POA: Insufficient documentation

## 2015-04-09 DIAGNOSIS — Z7982 Long term (current) use of aspirin: Secondary | ICD-10-CM | POA: Diagnosis not present

## 2015-04-09 DIAGNOSIS — J45909 Unspecified asthma, uncomplicated: Secondary | ICD-10-CM | POA: Diagnosis not present

## 2015-04-09 DIAGNOSIS — N3000 Acute cystitis without hematuria: Secondary | ICD-10-CM | POA: Insufficient documentation

## 2015-04-09 DIAGNOSIS — N12 Tubulo-interstitial nephritis, not specified as acute or chronic: Secondary | ICD-10-CM | POA: Insufficient documentation

## 2015-04-09 DIAGNOSIS — Z79899 Other long term (current) drug therapy: Secondary | ICD-10-CM | POA: Diagnosis not present

## 2015-04-09 DIAGNOSIS — Z792 Long term (current) use of antibiotics: Secondary | ICD-10-CM | POA: Insufficient documentation

## 2015-04-09 HISTORY — DX: Unspecified viral hepatitis C without hepatic coma: B19.20

## 2015-04-09 LAB — CBC
HEMATOCRIT: 40.7 % (ref 36.0–46.0)
HEMOGLOBIN: 14.1 g/dL (ref 12.0–15.0)
MCH: 29.7 pg (ref 26.0–34.0)
MCHC: 34.6 g/dL (ref 30.0–36.0)
MCV: 85.9 fL (ref 78.0–100.0)
Platelets: 229 10*3/uL (ref 150–400)
RBC: 4.74 MIL/uL (ref 3.87–5.11)
RDW: 13.4 % (ref 11.5–15.5)
WBC: 11.3 10*3/uL — ABNORMAL HIGH (ref 4.0–10.5)

## 2015-04-09 LAB — COMPREHENSIVE METABOLIC PANEL
ALBUMIN: 4.2 g/dL (ref 3.5–5.0)
ALK PHOS: 86 U/L (ref 38–126)
ALT: 94 U/L — ABNORMAL HIGH (ref 14–54)
ANION GAP: 9 (ref 5–15)
AST: 54 U/L — ABNORMAL HIGH (ref 15–41)
BUN: 8 mg/dL (ref 6–20)
CALCIUM: 8.9 mg/dL (ref 8.9–10.3)
CO2: 25 mmol/L (ref 22–32)
Chloride: 106 mmol/L (ref 101–111)
Creatinine, Ser: 0.69 mg/dL (ref 0.44–1.00)
GFR calc non Af Amer: >60 mL/min (ref >60)
GLUCOSE: 90 mg/dL (ref 65–99)
POTASSIUM: 3.5 mmol/L (ref 3.5–5.1)
Sodium: 140 mmol/L (ref 135–145)
TOTAL PROTEIN: 7.6 g/dL (ref 6.5–8.1)
Total Bilirubin: 0.3 mg/dL (ref 0.3–1.2)

## 2015-04-09 LAB — URINALYSIS, ROUTINE W REFLEX MICROSCOPIC
BILIRUBIN URINE: NEGATIVE
Glucose, UA: NEGATIVE mg/dL
Ketones, ur: NEGATIVE mg/dL
NITRITE: POSITIVE — AB
Protein, ur: 30 mg/dL — AB
SPECIFIC GRAVITY, URINE: 1.025 (ref 1.005–1.030)
pH: 5.5 (ref 5.0–8.0)

## 2015-04-09 LAB — LIPASE, BLOOD: Lipase: 22 U/L (ref 11–51)

## 2015-04-09 LAB — URINE MICROSCOPIC-ADD ON

## 2015-04-09 LAB — PREGNANCY, URINE: PREG TEST UR: NEGATIVE

## 2015-04-09 MED ORDER — MORPHINE SULFATE (PF) 4 MG/ML IV SOLN
4.0000 mg | Freq: Once | INTRAVENOUS | Status: AC
  Administered 2015-04-09: 4 mg via INTRAVENOUS

## 2015-04-09 MED ORDER — DIATRIZOATE MEGLUMINE & SODIUM 66-10 % PO SOLN
ORAL | Status: AC
  Filled 2015-04-09: qty 30

## 2015-04-09 MED ORDER — IOPAMIDOL (ISOVUE-300) INJECTION 61%
100.0000 mL | Freq: Once | INTRAVENOUS | Status: AC | PRN
  Administered 2015-04-09: 100 mL via INTRAVENOUS

## 2015-04-09 MED ORDER — ONDANSETRON HCL 4 MG/2ML IJ SOLN
4.0000 mg | Freq: Once | INTRAMUSCULAR | Status: AC
  Administered 2015-04-09: 4 mg via INTRAVENOUS
  Filled 2015-04-09: qty 2

## 2015-04-09 MED ORDER — MORPHINE SULFATE (PF) 4 MG/ML IV SOLN
INTRAVENOUS | Status: AC
  Filled 2015-04-09: qty 1

## 2015-04-09 MED ORDER — CEPHALEXIN 500 MG PO CAPS: 500.0000 mg | ORAL_CAPSULE | Freq: Three times a day (TID) | ORAL | Status: DC

## 2015-04-09 MED ORDER — MORPHINE SULFATE (PF) 2 MG/ML IV SOLN
4.0000 mg | Freq: Once | INTRAVENOUS | Status: AC
  Administered 2015-04-09: 4 mg via INTRAVENOUS
  Filled 2015-04-09: qty 2

## 2015-04-09 NOTE — Discharge Instructions (Signed)
°  Take motrin and tylenol as needed for pain. Take antibiotics as prescribed. Return for worsening symptoms, including fever, vomiting and unable to keep down food/fluids, worsening pain, or any other symptoms concerning to you.   Urinary Tract Infection A urinary tract infection (UTI) can occur any place along the urinary tract. The tract includes the kidneys, ureters, bladder, and urethra. A type of germ called bacteria often causes a UTI. UTIs are often helped with antibiotic medicine.  HOME CARE   If given, take antibiotics as told by your doctor. Finish them even if you start to feel better.  Drink enough fluids to keep your pee (urine) clear or pale yellow.  Avoid tea, drinks with caffeine, and bubbly (carbonated) drinks.  Pee often. Avoid holding your pee in for a long time.  Pee before and after having sex (intercourse).  Wipe from front to back after you poop (bowel movement) if you are a woman. Use each tissue only once. GET HELP RIGHT AWAY IF:   You have back pain.  You have lower belly (abdominal) pain.  You have chills.  You feel sick to your stomach (nauseous).  You throw up (vomit).  Your burning or discomfort with peeing does not go away.  You have a fever.  Your symptoms are not better in 3 days. MAKE SURE YOU:   Understand these instructions.  Will watch your condition.  Will get help right away if you are not doing well or get worse.   This information is not intended to replace advice given to you by your health care provider. Make sure you discuss any questions you have with your health care provider.   Document Released: 06/08/2007 Document Revised: 01/10/2014 Document Reviewed: 07/21/2011 Elsevier Interactive Patient Education [employer]2016 Elsevier Inc.

## 2015-04-09 NOTE — ED Provider Notes (Signed)
CSN: [id number]     Arrival date & time 04/09/15  1825 History   First MD Initiated Contact with Patient 04/09/15 1935     Chief Complaint  Patient presents with  . Abdominal Pain     (Consider location/radiation/quality/duration/timing/severity/associated sxs/prior Treatment) HPI 20 year old female who presents with 1 day of severe upper abdominal pain. Has never had pain like this before in the past. History of laparoscopy for uterine septum resection. States pain localized in the epigastric and right upper quadrant and radiates around her torso to the back. No dysuria or urinary frequency fevers. Having 3 days of loose stools during this period of time with nausea but no vomiting. No abnormal vaginal bleeding or discharge. History of Psoriatic arthritis, hepatitis C, and lung nodules all diagnosed last week Past Medical History  Diagnosis Date  . Asthma as a child  . Depression   . Double cervix   . Hepatitis C    Past Surgical History  Procedure Laterality Date  . Vaginal septum resection  06/15/2010    [name] -- [name], normal uterine contour external  . Uterine septum resection  08/2010    [facility]  2 cervices, nl ut ext contours  . Tonsillectomy    . Perineal laceration repair  09/23/2011    Procedure: SUTURE REPAIR PERINEAL LACERATION;  Surgeon: Tilda Burrow, MD;  Location: [facility];  Service: Gynecology;  Laterality: N/A;  Repair Obstetric Laceration   Family History  Problem Relation Age of Onset  . Dermatomyositis Mother   . Lupus Sister    Social History  Substance Use Topics  . Smoking status: Current Every Day Smoker -- 1.00 packs/day for 10 years    Types: Cigarettes  . Smokeless tobacco: Never Used  . Alcohol Use: No     Comment: occ.   OB History    Gravida Para Term Preterm AB TAB SAB Ectopic Multiple Living   Obstetric Comments   Septate uterus, with 2 cervices, normal external uterine contours, s/p resection of  uterine longitudinal septum 08/2010, and s/p resection of longitudinal vaginal septum 06/15/2010 Cervix length of RIGHT cervix 3.3 cm on 05/09/2011     Review of Systems 10/14 systems reviewed and are negative other than those stated in the HPI    Allergies  Amoxicillin and Onion  Home Medications   Prior to Admission medications   Medication Sig Start Date End Date Taking? Authorizing Provider  Aspirin-Salicylamide-Caffeine (BC HEADACHE) 325-95-16 MG TABS Take 1 packet by mouth daily as needed (for migraine pain).   Yes Historical Provider, MD  azithromycin (ZITHROMAX) 250 MG tablet Take 250-500 mg by mouth daily. Take two tablets on day 1 then take one tab on days 2 through 5   Yes Historical Provider, MD  citalopram (CELEXA) 20 MG tablet Take 20 mg by mouth daily.   Yes Historical Provider, MD  clobetasol (TEMOVATE) 0.05 % external solution Apply 1 application topically daily. 03/30/15  Yes Historical Provider, MD  clonazePAM (KLONOPIN) 0.5 MG tablet Take 0.5 mg by mouth 2 (two) times daily as needed for anxiety.    Yes Historical Provider, MD  diazepam (VALIUM) 10 MG tablet Take 10 mg by mouth 2 (two) times daily as needed for anxiety.    Yes Historical Provider, MD  gabapentin (NEURONTIN) 300 MG capsule Take 1 capsule (300 mg total) by mouth 3 (three) times daily. 04/18/14  Yes Christin Bach  V, MD  omeprazole (PRILOSEC) 40 MG capsule Take 40 mg by mouth daily as needed.   Yes Historical Provider, MD  cephALEXin (KEFLEX) 500 MG capsule Take 1 capsule (500 mg total) by mouth 3 (three) times daily. 04/09/15   Lavera Guise, MD   BP 110/74 mmHg  Pulse 81  Temp(Src) 98.2 F (36.8 C) (Oral)  Resp 14  Ht  (1.626 m)  Wt 129 lb (58.514 kg)  BMI 22.13 kg/m2  SpO2 100%  LMP 03/22/2015 Physical Exam Physical Exam  Nursing note and vitals reviewed. Constitutional: Well developed, well nourished, non-toxic, and in no acute distress Head: Normocephalic and atraumatic.  Mouth/Throat:  Oropharynx is clear and moist.  Neck: Normal range of motion. Neck supple.  Cardiovascular: Normal rate and regular rhythm.   Pulmonary/Chest: Effort normal and breath sounds normal.  Abdominal: Soft. There is RUQ and epigastric tenderness. There is no rebound and no guarding.  Musculoskeletal: Normal range of motion.  Neurological: Alert, no facial droop, fluent speech, moves all extremities symmetrically Skin: Skin is warm and dry.  Psychiatric: Cooperative  ED Course  Procedures (including critical care time) Labs Review Labs Reviewed  COMPREHENSIVE METABOLIC PANEL - Abnormal; Notable for the following:    AST 54 (*)    ALT 94 (*)    All other components within normal limits  CBC - Abnormal; Notable for the following:    WBC 11.3 (*)    All other components within normal limits  URINALYSIS, ROUTINE W REFLEX MICROSCOPIC (NOT AT [facility]) - Abnormal; Notable for the following:    APPearance HAZY (*)    Hgb urine dipstick MODERATE (*)    Protein, ur 30 (*)    Nitrite POSITIVE (*)    Leukocytes, UA SMALL (*)    All other components within normal limits  URINE MICROSCOPIC-ADD ON - Abnormal; Notable for the following:    Squamous Epithelial / LPF 6-30 (*)    Bacteria, UA MANY (*)    All other components within normal limits  URINE CULTURE  LIPASE, BLOOD  PREGNANCY, URINE    Imaging Review Ct Abdomen Pelvis W Contrast  04/09/2015  CLINICAL DATA:  Severe upper abdominal pain radiating into back. EXAM: CT ABDOMEN AND PELVIS WITH CONTRAST TECHNIQUE: Multidetector CT imaging of the abdomen and pelvis was performed using the standard protocol following bolus administration of intravenous contrast. CONTRAST:  ISOVUE-300 IOPAMIDOL (ISOVUE-300) INJECTION 61% COMPARISON:  None. FINDINGS: Lower chest:  Normal visualized lung bases. Hepatobiliary: The liver is normal. Cholelithiasis without evidence of visible gallbladder inflammation or biliary ductal dilatation. Pancreas: No mass,  inflammatory changes, or other significant abnormality. Spleen: Within normal limits in size and appearance. Adrenals/Urinary Tract: The kidneys have a normal appearance bilaterally. No hydronephrosis, renal lesion, inflammation or urinary tract calculi. The bladder is unremarkable. Stomach/Bowel: Bowel shows no evidence of obstruction or inflammation. No free air. Vascular/Lymphatic: No pathologically enlarged lymph nodes. No evidence of abdominal aortic aneurysm. Reproductive: Uterus and adnexal regions appear unremarkable by CT. No free fluid in the pelvis. Other: No focal abscess identified.  No evidence of hernia. Musculoskeletal:  Normal bony structures. IMPRESSION: Evidence of cholelithiasis. No signs by CT of cholecystitis or biliary obstruction. Electronically Signed   By: Irish Lack M.D.   On: 04/09/2015 23:26   I have personally reviewed and evaluated these images and lab results as part of my medical decision-making.   EKG Interpretation None      MDM   Final diagnoses:  RUQ pain  Acute cystitis without hematuria  Pyelonephritis    20 year old female who presents with abdominal pain and back pain. On presentation she is nontoxic in no acute distress with normal vital signs. Initially while palpating her abdomen while she is distracted talking, she does not appear to have any pain. However with direct abdominal examination she starts to have voluntary guarding, wincing, and reporting pain over the right upper quadrant and right lower quadrant. does not suspect that this is true organic pain that she is feeling. Basic blood work including CBC, compr ehensive metabolic panel, lipase are unremarkable and near her baseline. She is not pregnant. UA positive for nitrites and leukocytes, may be suggestive of acute cystitis. Did perform CT abd/pelvis. This is negative for acute processes. Given course of keflex for possible UTI. Strict return and follow-up instructions reviewed. She  expressed understanding of all discharge instructions and felt comfortable with the plan of care.   Lavera Guiseana Duo Bary Limbach, MD 04/09/15 [phone]

## 2015-04-09 NOTE — ED Notes (Signed)
Pt reports sudden onset of bilateral upper abdominal pain with nausea. Pt states she was recently diagnosed with liver failure and hepatitis C. Pt states pain is a ripping sensation. Pt also has appt with rheumatologist on Monday for a possible autoimmune disease.

## 2015-04-12 LAB — URINE CULTURE

## 2015-04-13 ENCOUNTER — Telehealth (HOSPITAL_BASED_OUTPATIENT_CLINIC_OR_DEPARTMENT_OTHER): Payer: Self-pay | Admitting: Emergency Medicine

## 2015-04-13 NOTE — Telephone Encounter (Signed)
Post ED Visit - Positive Culture Follow-up  Culture report reviewed by antimicrobial stewardship pharmacist:  []  Enzo BiNathan Batchelder, Pharm.D. []  Celedonio MiyamotoJeremy Frens, Pharm.D., BCPS []  Garvin FilaMike Maccia, Pharm.D. []  Georgina PillionElizabeth Martin, Pharm.D., BCPS []  Brow[place] MillsMinh Pham, [address]Pharm.D., BCPS, AAHIVP []  Estella HuskMichelle Turner, Pharm.D., BCPS, AAHIVP []  Tennis Mustassie Stewart, Pharm.D. []  Rob Oswaldo DoneVincent, [address] BoulevardPharm[address]Dagoberto Ligas. [place] PharmD  Positive urine culture E. coli Treated with cephalexin and azithromycin, organism sensitive to the same and no further patient follow-up is required at this time.  Berle MullMiller, Cindi Ghazarian 04/13/2015, 9:35 AM

## 2015-09-02 NOTE — Progress Notes (Deleted)
Cardiology Office Note   Date:  09/03/2015   ID:  Linda Spence, DOB [birth year], MRN [id number]  PCP:  PROVIDER NOT IN SYSTEM  Cardiologist:   Jenkins Rouge, MD   No chief complaint on file.     History of Present Illness: Linda Spence is a 20 y.o. female who presents for evaluation of chest pain  Seen by Dr Gerarda Fraction on 07/20/15 Indicated 2-3 months of pains onset gradual sharp not related to exertion.  Worse with deep breath and touch. No benefit with antacids Associated dyspnea History of anxiety and depression Smokes 8 cigs/day Family history of lupus Labs reviewed and ESR normal, and RF negative no anemia   Past Medical History:  Diagnosis Date  . Asthma as a child  . Depression   . Double cervix   . Hepatitis C     Past Surgical History:  Procedure Laterality Date  . PERINEAL LACERATION REPAIR  09/23/2011   Procedure: SUTURE REPAIR PERINEAL LACERATION;  Surgeon: Jonnie Kind, MD;  Location: [facility];  Service: Gynecology;  Laterality: N/A;  Repair Obstetric Laceration  . TONSILLECTOMY    . UTERINE SEPTUM RESECTION  08/2010   [facility]  2 cervices, nl ut ext contours  . VAGINAL SEPTUM RESECTION  06/15/2010   [name], normal uterine contour external     Current Outpatient Prescriptions  Medication Sig Dispense Refill  . Aspirin-Salicylamide-Caffeine (BC HEADACHE) 325-95-16 MG TABS Take 1 packet by mouth daily as needed (for migraine pain).    Marland Kitchen azithromycin (ZITHROMAX) 250 MG tablet Take 250-500 mg by mouth daily. Take two tablets on day 1 then take one tab on days 2 through 5    . cephALEXin (KEFLEX) 500 MG capsule Take 1 capsule (500 mg total) by mouth 3 (three) times daily. 30 capsule 0  . citalopram (CELEXA) 20 MG tablet Take 20 mg by mouth daily.    . clobetasol (TEMOVATE) 0.05 % external solution Apply 1 application topically daily.    . clonazePAM (KLONOPIN) 0.5 MG tablet Take 0.5 mg by mouth 2 (two) times daily as needed for  anxiety.     . diazepam (VALIUM) 10 MG tablet Take 10 mg by mouth 2 (two) times daily as needed for anxiety.     . gabapentin (NEURONTIN) 300 MG capsule Take 1 capsule (300 mg total) by mouth 3 (three) times daily. 90 capsule 1  . omeprazole (PRILOSEC) 40 MG capsule Take 40 mg by mouth daily as needed.     No current facility-administered medications for this visit.     Allergies:   Amoxicillin and Onion    Social History:  The patient  reports that she has been smoking Cigarettes.  She has a 10.00 pack-year smoking history. She has never used smokeless tobacco. She reports that she does not drink alcohol or use drugs.   Family History:  The patient's family history includes Dermatomyositis in her mother; Lupus in her sister.    ROS:  Please see the history of present illness.   Otherwise, review of systems are positive for {NONE DEFAULTED:18576::"none"}.   All other systems are reviewed and negative.    PHYSICAL EXAM: VS:  There were no vitals taken for this visit. , BMI There is no height or weight on file to calculate BMI. Affect appropriate Healthy:  appears stated age 29: normal Neck supple with no adenopathy JVP normal no bruits no thyromegaly Lungs clear with no wheezing and good diaphragmatic motion  Heart:  S1/S2 no murmur, no rub, gallop or click PMI normal Abdomen: benighn, BS positve, no tenderness, no AAA no bruit.  No HSM or HJR Distal pulses intact with no bruits No edema Neuro non-focal Skin warm and dry No muscular weakness    EKG:  07/20/15 SR rate 70 inferior ST flattening and inverted T in lead 3    Recent Labs: 04/09/2015: ALT 94; BUN 8; Creatinine, Ser 0.69; Hemoglobin 14.1; Platelets 229; Potassium 3.5; Sodium 140    Lipid Panel No results found for: CHOL, TRIG, HDL, CHOLHDL, VLDL, LDLCALC, LDLDIRECT    Wt Readings from Last 3 Encounters:  04/09/15 129 lb (58.5 kg)  11/19/14 140 lb (63.5 kg)  04/18/14 142 lb 8 oz (64.6 kg)      Other  studies Reviewed: Additional studies/ records that were reviewed today include: ***.    ASSESSMENT AND PLAN:  1.  ***   Current medicines are reviewed at length with the patient today.  The patient {ACTIONS; HAS/DOES NOT HAVE:19233} concerns regarding medicines.  The following changes have been made:  {PLAN; NO CHANGE:13088:s}  Labs/ tests ordered today include: *** No orders of the defined types were placed in this encounter.    Disposition:   FU with ***     Signed, Jenkins Rouge, MD  09/03/2015 10:24 AM    [facility] [address], [place], [place]  [address] Phone: [phone]; Fax: [phone]

## 2015-09-03 ENCOUNTER — Ambulatory Visit: Payer: Medicaid Other | Admitting: Cardiovascular Disease

## 2015-09-25 ENCOUNTER — Encounter: Payer: Self-pay | Admitting: Cardiovascular Disease

## 2015-09-25 ENCOUNTER — Ambulatory Visit (INDEPENDENT_AMBULATORY_CARE_PROVIDER_SITE_OTHER): Payer: Medicaid Other | Admitting: Cardiovascular Disease

## 2015-09-25 VITALS — BP 118/62 | HR 89 | Ht 64.0 in | Wt 136.0 lb

## 2015-09-25 DIAGNOSIS — R072 Precordial pain: Secondary | ICD-10-CM

## 2015-09-25 DIAGNOSIS — M329 Systemic lupus erythematosus, unspecified: Secondary | ICD-10-CM | POA: Diagnosis not present

## 2015-09-25 NOTE — Progress Notes (Signed)
CARDIOLOGY CONSULT NOTE  Patient ID: Linda Spence MRN: [id number] DOB/AGE: [birth year] 20 y.o.  Admit date: (Not on file) Primary Physician: Cassell Smiles., MD Referring Physician:   Reason for Consultation: chest pain  HPI: 20 yr old woman referred for evaluation of chest pain. ECG in July showed normal sinus rhythm. [name] Her appointment with a rheumatologist is in January. She has had constant chest pain over the past 5 or 6 years but has now had it daily the past 2 weeks. It is not affected by exertion. It is retrosternal. She has been having night sweats. She has associated shortness of breath. She also has bronchitis and a long history of tobacco abuse.    Allergies  Allergen Reactions  . Amoxicillin     Really sick to stomach  . Onion Other (See Comments)    "Feels like throat is burning"    Current Outpatient Prescriptions  Medication Sig Dispense Refill  . citalopram (CELEXA) 20 MG tablet Take 20 mg by mouth daily.    . clobetasol (TEMOVATE) 0.05 % external solution Apply 1 application topically daily.    . diazepam (VALIUM) 10 MG tablet Take 10 mg by mouth 2 (two) times daily as needed for anxiety.     . gabapentin (NEURONTIN) 300 MG capsule Take 1 capsule (300 mg total) by mouth 3 (three) times daily. 90 capsule 1  . omeprazole (PRILOSEC) 40 MG capsule Take 40 mg by mouth daily as needed.     No current facility-administered medications for this visit.     Past Medical History:  Diagnosis Date  . Asthma as a child  . Depression   . Double cervix   . Hepatitis C     Past Surgical History:  Procedure Laterality Date  . PERINEAL LACERATION REPAIR  09/23/2011   Procedure: SUTURE REPAIR PERINEAL LACERATION;  Surgeon: Tilda Burrow, MD;  Location: [facility];  Service: Gynecology;  Laterality: N/A;  Repair Obstetric Laceration  . TONSILLECTOMY    . UTERINE SEPTUM RESECTION   08/2010   [facility]  2 cervices, nl ut ext contours  . VAGINAL SEPTUM RESECTION  06/15/2010   [name], normal uterine contour external    Social History   Social History  . Marital status: Single    Spouse name: N/A  . Number of children: N/A  . Years of education: N/A   Occupational History  . Not on file.   Social History Main Topics  . Smoking status: Current Every Day Smoker    Packs/day: 1.00    Years: 10.00    Types: Cigarettes    Start date: 09/25/2003  . Smokeless tobacco: Never Used  . Alcohol use No     Comment: occ.  . Drug use: No  . Sexual activity: Yes    Birth control/ protection: None   Other Topics Concern  . Not on file   Social History Narrative  . No narrative on file     No family history of premature CAD in 1st degree relatives.  Prior to Admission medications   Medication Sig Start Date End Date Taking? Authorizing Provider  Aspirin-Salicylamide-Caffeine (BC HEADACHE) 325-95-16 MG TABS Take 1 packet by mouth daily as needed (for migraine pain).    Historical Provider, MD  azithromycin (ZITHROMAX) 250 MG tablet Take 250-500 mg by mouth daily. Take two tablets on day 1 then take one  tab on days 2 through 5    Historical Provider, MD  cephALEXin (KEFLEX) 500 MG capsule Take 1 capsule (500 mg total) by mouth 3 (three) times daily. 04/09/15   Lavera Guiseana Duo Liu, MD  citalopram (CELEXA) 20 MG tablet Take 20 mg by mouth daily.    Historical Provider, MD  clobetasol (TEMOVATE) 0.05 % external solution Apply 1 application topically daily. 03/30/15   Historical Provider, MD  clonazePAM (KLONOPIN) 0.5 MG tablet Take 0.5 mg by mouth 2 (two) times daily as needed for anxiety.     Historical Provider, MD  diazepam (VALIUM) 10 MG tablet Take 10 mg by mouth 2 (two) times daily as needed for anxiety.     Historical Provider, MD  gabapentin (NEURONTIN) 300 MG capsule Take 1 capsule (300 mg total) by mouth 3 (three) times daily. 04/18/14   Tilda BurrowJohn V  Ferguson, MD  omeprazole (PRILOSEC) 40 MG capsule Take 40 mg by mouth daily as needed.    Historical Provider, MD     Review of systems complete and found to be negative unless listed above in HPI     Physical exam Blood pressure 120/64, pulse 89, height 5\' 4"  (1.626 m), weight 136 lb (61.7 kg), last menstrual period 09/15/2015, SpO2 97 %. General: NAD Neck: No JVD, no thyromegaly or thyroid nodule.  Lungs: Diffuse rhonchi and wheezes CV: Nondisplaced PMI. Regular rate and rhythm, normal S1/S2, no S3/S4, no murmur.  No peripheral edema.    Abdomen: Soft, no distention.  Skin: Intact without lesions or rashes.  Neurologic: Alert and oriented x 3.  Extremities: No clubbing or cyanosis.  HEENT: Normal.   ECG: Most recent ECG reviewed.  Labs:   Lab Results  Component Value Date   WBC 11.3 (H) 04/09/2015   HGB 14.1 04/09/2015   HCT 40.7 04/09/2015   MCV 85.9 04/09/2015   PLT 229 04/09/2015   No results for input(s): NA, K, CL, CO2, BUN, CREATININE, CALCIUM, PROT, BILITOT, ALKPHOS, ALT, AST, GLUCOSE in the last 168 hours.  Invalid input(s): LABALBU No results found for: CKTOTAL, CKMB, CKMBINDEX, TROPONINI No results found for: CHOL No results found for: HDL No results found for: LDLCALC No results found for: TRIG No results found for: CHOLHDL No results found for: LDLDIRECT       Studies: No results found.  ASSESSMENT AND PLAN:  1. Chest pain: Very low pretest probability for ischemic heart disease. Given autoimmune disease, may have pericardial effusion and/or inflammation, although ECG not suggestive. I will order a 2-D echocardiogram with Doppler to evaluate cardiac structure, function, and regional wall motion.  Dispo: fu to be determined.   Signed: Prentice DockerSuresh Verdean Murin, M.D., F.A.C.C.  09/25/2015, 10:32 AM

## 2015-09-25 NOTE — Patient Instructions (Signed)
Your physician recommends that you schedule a follow-up appointment in:  To be determined after echo    Your physician has requested that you have an echocardiogram. Echocardiography is a painless test that uses sound waves to create images of your heart. It provides your doctor with information about the size and shape of your heart and how well your heart's chambers and valves are working. This procedure takes approximately one hour. There are no restrictions for this procedure.      Thank you for choosing [facility] !         

## 2015-10-01 ENCOUNTER — Other Ambulatory Visit: Payer: Self-pay

## 2015-10-01 ENCOUNTER — Emergency Department (HOSPITAL_COMMUNITY): Payer: Medicaid Other

## 2015-10-01 ENCOUNTER — Encounter (HOSPITAL_COMMUNITY): Payer: Self-pay | Admitting: Emergency Medicine

## 2015-10-01 ENCOUNTER — Observation Stay (HOSPITAL_COMMUNITY)
Admission: EM | Admit: 2015-10-01 | Discharge: 2015-10-01 | Discharge status: Against Medical Advice | Payer: Medicaid Other | Attending: Family Medicine | Admitting: Family Medicine

## 2015-10-01 ENCOUNTER — Ambulatory Visit (HOSPITAL_BASED_OUTPATIENT_CLINIC_OR_DEPARTMENT_OTHER)
Admission: RE | Admit: 2015-10-01 | Discharge: 2015-10-01 | Disposition: A | Discharge status: Home / Self Care | Payer: Medicaid Other | Source: Ambulatory Visit | Attending: Cardiovascular Disease | Admitting: Cardiovascular Disease

## 2015-10-01 ENCOUNTER — Other Ambulatory Visit (HOSPITAL_COMMUNITY): Payer: Self-pay

## 2015-10-01 DIAGNOSIS — R102 Pelvic and perineal pain: Secondary | ICD-10-CM | POA: Diagnosis not present

## 2015-10-01 DIAGNOSIS — N83202 Unspecified ovarian cyst, left side: Secondary | ICD-10-CM | POA: Insufficient documentation

## 2015-10-01 DIAGNOSIS — I340 Nonrheumatic mitral (valve) insufficiency: Secondary | ICD-10-CM | POA: Insufficient documentation

## 2015-10-01 DIAGNOSIS — G8929 Other chronic pain: Secondary | ICD-10-CM | POA: Diagnosis present

## 2015-10-01 DIAGNOSIS — R072 Precordial pain: Secondary | ICD-10-CM | POA: Insufficient documentation

## 2015-10-01 DIAGNOSIS — Z880 Allergy status to penicillin: Secondary | ICD-10-CM | POA: Diagnosis not present

## 2015-10-01 DIAGNOSIS — Z87440 Personal history of urinary (tract) infections: Secondary | ICD-10-CM | POA: Diagnosis not present

## 2015-10-01 DIAGNOSIS — Z886 Allergy status to analgesic agent status: Secondary | ICD-10-CM | POA: Insufficient documentation

## 2015-10-01 DIAGNOSIS — N390 Urinary tract infection, site not specified: Secondary | ICD-10-CM | POA: Insufficient documentation

## 2015-10-01 DIAGNOSIS — F17210 Nicotine dependence, cigarettes, uncomplicated: Secondary | ICD-10-CM | POA: Insufficient documentation

## 2015-10-01 DIAGNOSIS — K8020 Calculus of gallbladder without cholecystitis without obstruction: Secondary | ICD-10-CM | POA: Diagnosis not present

## 2015-10-01 DIAGNOSIS — R109 Unspecified abdominal pain: Secondary | ICD-10-CM | POA: Diagnosis present

## 2015-10-01 DIAGNOSIS — R1032 Left lower quadrant pain: Secondary | ICD-10-CM | POA: Diagnosis not present

## 2015-10-01 DIAGNOSIS — B182 Chronic viral hepatitis C: Secondary | ICD-10-CM | POA: Diagnosis present

## 2015-10-01 DIAGNOSIS — B1920 Unspecified viral hepatitis C without hepatic coma: Secondary | ICD-10-CM | POA: Diagnosis not present

## 2015-10-01 DIAGNOSIS — G9340 Encephalopathy, unspecified: Secondary | ICD-10-CM | POA: Diagnosis not present

## 2015-10-01 DIAGNOSIS — F329 Major depressive disorder, single episode, unspecified: Secondary | ICD-10-CM | POA: Insufficient documentation

## 2015-10-01 DIAGNOSIS — J45909 Unspecified asthma, uncomplicated: Secondary | ICD-10-CM | POA: Diagnosis not present

## 2015-10-01 DIAGNOSIS — Z91018 Allergy to other foods: Secondary | ICD-10-CM | POA: Diagnosis not present

## 2015-10-01 DIAGNOSIS — I071 Rheumatic tricuspid insufficiency: Secondary | ICD-10-CM | POA: Insufficient documentation

## 2015-10-01 DIAGNOSIS — Z5321 Procedure and treatment not carried out due to patient leaving prior to being seen by health care provider: Secondary | ICD-10-CM | POA: Insufficient documentation

## 2015-10-01 LAB — COMPREHENSIVE METABOLIC PANEL
ALBUMIN: 3.3 g/dL — AB (ref 3.5–5.0)
ALK PHOS: 61 U/L (ref 38–126)
ALT: 54 U/L (ref 14–54)
AST: 33 U/L (ref 15–41)
Anion gap: 4 — ABNORMAL LOW (ref 5–15)
BUN: 8 mg/dL (ref 6–20)
CALCIUM: 8.3 mg/dL — AB (ref 8.9–10.3)
CO2: 28 mmol/L (ref 22–32)
CREATININE: 0.7 mg/dL (ref 0.44–1.00)
Chloride: 104 mmol/L (ref 101–111)
GFR calc Af Amer: >60 mL/min (ref >60)
GFR calc non Af Amer: >60 mL/min (ref >60)
GLUCOSE: 108 mg/dL — AB (ref 65–99)
Potassium: 3.5 mmol/L (ref 3.5–5.1)
SODIUM: 136 mmol/L (ref 135–145)
Total Bilirubin: 0.2 mg/dL — ABNORMAL LOW (ref 0.3–1.2)
Total Protein: 6.1 g/dL — ABNORMAL LOW (ref 6.5–8.1)

## 2015-10-01 LAB — ECHOCARDIOGRAM COMPLETE
AVLVOTPG: 4 mmHg
E/e' ratio: 3.75
EWDT: 268 ms
FS: 37 % (ref 28–44)
IV/PV OW: 1.19
LA ID, A-P, ES: 35 mm
LA diam end sys: 35 mm
LA diam index: 2.09 cm/m2
LAVOL: 32 mL
LAVOLA4C: 28.5 mL
LAVOLIN: 19.1 mL/m2
LDCA: 2.84 cm2
LV E/e'average: 3.75
LV PW d: 8.17 mm — AB (ref 0.6–1.1)
LV TDI E'LATERAL: 21.2
LV e' LATERAL: 21.2 cm/s
LVDIAVOL: 67 mL (ref 46–106)
LVDIAVOLIN: 40 mL/m2
LVEEMED: 3.75
LVOT SV: 59 mL
LVOT VTI: 20.8 cm
LVOTD: 19 mm
LVOTPV: 106 cm/s
LVSYSVOL: 23 mL (ref 14–42)
LVSYSVOLIN: 13 mL/m2
Lateral S' vel: 11.3 cm/s
MV Dec: 268
MV Peak grad: 3 mmHg
MV pk E vel: 79.6 m/s
MVPKAVEL: 44.7 m/s
RV TAPSE: 19.6 mm
Simpson's disk: 66
Stroke v: 44 ml
TDI e' medial: 10.4

## 2015-10-01 LAB — BLOOD GAS, VENOUS
ACID-BASE EXCESS: 4.3 mmol/L — AB (ref 0.0–2.0)
Bicarbonate: 27 mmol/L (ref 20.0–28.0)
FIO2: 21
O2 SAT: 89.8 %
PCO2 VEN: 55.9 mmHg (ref 44.0–60.0)
pH, Ven: 7.344 (ref 7.250–7.430)
pO2, Ven: 57.7 mmHg — ABNORMAL HIGH (ref 32.0–45.0)

## 2015-10-01 LAB — RAPID URINE DRUG SCREEN, HOSP PERFORMED
AMPHETAMINES: NOT DETECTED
BARBITURATES: NOT DETECTED
Benzodiazepines: POSITIVE — AB
Cocaine: NOT DETECTED
OPIATES: POSITIVE — AB
TETRAHYDROCANNABINOL: POSITIVE — AB

## 2015-10-01 LAB — LIPASE, BLOOD: Lipase: 12 U/L (ref 11–51)

## 2015-10-01 LAB — URINALYSIS, ROUTINE W REFLEX MICROSCOPIC
Bilirubin Urine: NEGATIVE
Glucose, UA: NEGATIVE mg/dL
Hgb urine dipstick: NEGATIVE
KETONES UR: NEGATIVE mg/dL
LEUKOCYTES UA: NEGATIVE
NITRITE: NEGATIVE
PH: 5.5 (ref 5.0–8.0)
PROTEIN: NEGATIVE mg/dL
Specific Gravity, Urine: >1.03 — ABNORMAL HIGH (ref 1.005–1.030)

## 2015-10-01 LAB — CBC WITH DIFFERENTIAL/PLATELET
BASOS PCT: 1 %
Basophils Absolute: 0.1 10*3/uL (ref 0.0–0.1)
EOS ABS: 1 10*3/uL — AB (ref 0.0–0.7)
Eosinophils Relative: 11 %
HCT: 35.9 % — ABNORMAL LOW (ref 36.0–46.0)
HEMOGLOBIN: 12.1 g/dL (ref 12.0–15.0)
LYMPHS ABS: 4.3 10*3/uL — AB (ref 0.7–4.0)
Lymphocytes Relative: 45 %
MCH: 29.2 pg (ref 26.0–34.0)
MCHC: 33.7 g/dL (ref 30.0–36.0)
MCV: 86.5 fL (ref 78.0–100.0)
Monocytes Absolute: 0.6 10*3/uL (ref 0.1–1.0)
Monocytes Relative: 6 %
NEUTROS ABS: 3.6 10*3/uL (ref 1.7–7.7)
NEUTROS PCT: 37 %
Platelets: 199 10*3/uL (ref 150–400)
RBC: 4.15 MIL/uL (ref 3.87–5.11)
RDW: 13.3 % (ref 11.5–15.5)
WBC: 9.5 10*3/uL (ref 4.0–10.5)

## 2015-10-01 LAB — PREGNANCY, URINE: Preg Test, Ur: NEGATIVE

## 2015-10-01 LAB — MRSA PCR SCREENING: MRSA by PCR: NEGATIVE

## 2015-10-01 LAB — CBG MONITORING, ED: Glucose-Capillary: 122 mg/dL — ABNORMAL HIGH (ref 65–99)

## 2015-10-01 LAB — AMMONIA: Ammonia: 27 umol/L (ref 9–35)

## 2015-10-01 MED ORDER — NALOXONE HCL 2 MG/2ML IJ SOSY
1.0000 mg | PREFILLED_SYRINGE | Freq: Once | INTRAMUSCULAR | Status: AC
  Administered 2015-10-01: 1 mg via INTRAVENOUS
  Filled 2015-10-01: qty 2

## 2015-10-01 MED ORDER — SODIUM CHLORIDE 0.9% FLUSH: 3.0000 mL | Freq: Two times a day (BID) | INTRAVENOUS | Status: DC

## 2015-10-01 MED ORDER — ALBUTEROL SULFATE (2.5 MG/3ML) 0.083% IN NEBU: 3.0000 mL | INHALATION_SOLUTION | Freq: Four times a day (QID) | RESPIRATORY_TRACT | Status: DC | PRN

## 2015-10-01 MED ORDER — CLOBETASOL PROPIONATE 0.05 % EX SOLN
1.0000 "application " | Freq: Every day | CUTANEOUS | Status: DC
  Filled 2015-10-01: qty 50

## 2015-10-01 MED ORDER — SODIUM CHLORIDE 0.9 % IV SOLN: 250.0000 mL | INTRAVENOUS | Status: DC | PRN

## 2015-10-01 MED ORDER — SODIUM CHLORIDE 0.9 % IV BOLUS (SEPSIS)
1000.0000 mL | Freq: Once | INTRAVENOUS | Status: AC
  Administered 2015-10-01: 1000 mL via INTRAVENOUS

## 2015-10-01 MED ORDER — SODIUM CHLORIDE 0.9% FLUSH: 3.0000 mL | INTRAVENOUS | Status: DC | PRN

## 2015-10-01 MED ORDER — IBUPROFEN 400 MG PO TABS: 600.0000 mg | ORAL_TABLET | Freq: Three times a day (TID) | ORAL | Status: DC | PRN

## 2015-10-01 MED ORDER — GABAPENTIN 300 MG PO CAPS: 300.0000 mg | ORAL_CAPSULE | Freq: Three times a day (TID) | ORAL | Status: DC

## 2015-10-01 MED ORDER — AMMONIA AROMATIC IN INHA
RESPIRATORY_TRACT | Status: AC
  Filled 2015-10-01: qty 10

## 2015-10-01 MED ORDER — NALOXONE HCL 0.4 MG/ML IJ SOLN
0.4000 mg | Freq: Once | INTRAMUSCULAR | Status: AC
  Administered 2015-10-01: 0.4 mg via INTRAVENOUS
  Filled 2015-10-01: qty 1

## 2015-10-01 MED ORDER — SODIUM CHLORIDE 0.9 % IV SOLN
INTRAVENOUS | Status: AC
  Administered 2015-10-01: 20:00:00 via INTRAVENOUS
  Administered 2015-10-01: 900 mL via INTRAVENOUS

## 2015-10-01 MED ORDER — NALOXONE HCL 0.4 MG/ML IJ SOLN: 0.4000 mg | Freq: Once | INTRAMUSCULAR | Status: DC

## 2015-10-01 MED ORDER — CEPHALEXIN 500 MG PO CAPS
500.0000 mg | ORAL_CAPSULE | Freq: Four times a day (QID) | ORAL | Status: DC
  Filled 2015-10-01 (×3): qty 1

## 2015-10-01 MED ORDER — CITALOPRAM HYDROBROMIDE 20 MG PO TABS: 20.0000 mg | ORAL_TABLET | Freq: Every day | ORAL | Status: DC

## 2015-10-01 NOTE — ED Notes (Signed)
No response to narcan, MD updated.

## 2015-10-01 NOTE — Progress Notes (Signed)
Patient is expressing that she may leave AMA. Explained the possible seriousness of her condition. She states she will think about it.

## 2015-10-01 NOTE — ED Notes (Signed)
Pt given ammonia inhalant for ams, with response, pt woke up, MD at bedside to witness.  Respiratory called to pick up VBG sample, will be down here shortly to pick.

## 2015-10-01 NOTE — Progress Notes (Signed)
Patient left AMA boyfriend walked patient out. Signed Against Medical Advice form and left.

## 2015-10-01 NOTE — ED Notes (Signed)
Pt would arouse with sternal rub after narcan,

## 2015-10-01 NOTE — ED Notes (Signed)
No change in status post-narcan.

## 2015-10-01 NOTE — ED Notes (Signed)
Pt states that she is having blurred vision and is very fatigued. Pt states that her urine is amber colored and is peeing less frequently. Pt states that she is bloated and has swelling in her hands and feet. Pt's abdomen is tender to touch a long with her left side radiating towards her back.

## 2015-10-01 NOTE — ED Notes (Signed)
Pt less responsive at this time, responds to voice but has a hard time staying awake.  Dr. Manus Gunningancour notified of symtpoms. Dr. Manus Gunningancour at bedside to assess pt.

## 2015-10-01 NOTE — ED Provider Notes (Signed)
[facility] Provider Note   CSN: [id number] Arrival date & time: 10/01/15  1415     History   Chief Complaint Chief Complaint  Patient presents with  . Pyelonephritis    HPI Bufford LopeMargaret E Inglis is a 20 y.o. female.  Patient sleepy but arousable. She is difficult historian. Reports left-sided abdominal pain ongoing for the past week. Has a history of hepatitis C but says she is not currently on treatment because her kidney function was too high. Labs show normal kidney function last month. She endorses generalized weakness, loss of appetite and nausea without vomiting. Last bowel movement this morning. Denies fever. Endorses pain with urination but no hematuria. States she has been on Keflex for the past 2 days for kidney infection and prior to this she was on levofloxacin. She denies any abnormal vaginal bleeding or discharge. She denies any headache. She endorses generalized fatigue and blurry vision. She feels like she is bloated and swollen her hands or feet. She states she was told she could not tolearte and hepatitis C treatment because her kidney function was elevated and I do not see any of this and any records.   The history is provided by the patient. The history is limited by the condition of the patient.    Past Medical History:  Diagnosis Date  . Asthma as a child  . Depression   . Double cervix   . Hepatitis C   . Liver disease     Patient Active Problem List   Diagnosis Date Noted  . Chronic pelvic pain in female 11/19/2014  . Dyspareunia, female 11/19/2014  . 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 Double cervix     Past Surgical History:  Procedure Laterality Date  . PERINEAL LACERATION REPAIR  09/23/2011   Procedure: SUTURE REPAIR PERINEAL LACERATION;  Surgeon: Tilda BurrowJohn V Ferguson, MD;  Location: [facility];  Service: Gynecology;  Laterality: N/A;  Repair Obstetric Laceration  . TONSILLECTOMY    . UTERINE [facility]  [facility]  2 cervices, nl ut  ext contours  . VAGINAL SEPTUM RESECTION  06/15/2010   [name], normal uterine contour external    OB History    Gravida Para Term Preterm AB Living   3 1 1   1 1    SAB TAB Ectopic Multiple Live Births   1       1      Obstetric Comments   Septate uterus, with 2 cervices, normal external uterine contours, s/p resection of uterine longitudinal septum 08/2010, and s/p resection of longitudinal vaginal septum 06/15/2010 Cervix length of RIGHT cervix 3.3 cm on 05/09/2011       Home Medications    Prior to Admission medications   Medication Sig Start Date End Date Taking? Authorizing Provider  albuterol (PROVENTIL HFA) 108 (90 Base) MCG/ACT inhaler Inhale 2 puffs into the lungs every 6 (six) hours as needed for wheezing or shortness of breath.   Yes Historical Provider, MD  cephALEXin (KEFLEX) 500 MG capsule Take 500 mg by mouth 4 (four) times daily.   Yes Historical Provider, MD  citalopram (CELEXA) 20 MG tablet Take 20 mg by mouth daily.   Yes Historical Provider, MD  clobetasol (TEMOVATE) 0.05 % external solution Apply 1 application topically daily. 03/30/15  Yes Historical Provider, MD  diazepam (VALIUM) 10 MG tablet Take 10 mg by mouth 2 (two) times daily as needed for anxiety.    Yes Historical Provider, MD  gabapentin (  NEURONTIN) 300 MG capsule Take 1 capsule (300 mg total) by mouth 3 (three) times daily. 04/18/14  Yes Tilda Burrow, MD  Ibuprofen (ADVIL MIGRAINE PO) Take by mouth 2 (two) times daily as needed.   Yes Historical Provider, MD  omeprazole (PRILOSEC) 40 MG capsule Take 40 mg by mouth daily as needed.   Yes Historical Provider, MD    Family History Family History  Problem Relation Age of Onset  . Dermatomyositis Mother   . Lupus Sister     Social History Social History  Substance Use Topics  . Smoking status: Current Every Day Smoker    Packs/day: 1.00    Years: 10.00    Types: Cigarettes    Start date: 09/25/2003  . Smokeless tobacco: Never  Used  . Alcohol use No     Comment: occ.     Allergies   Amoxicillin; Onion; and Tylenol [acetaminophen]   Review of Systems Review of Systems  Constitutional: Positive for activity change, appetite change, chills and fatigue.  HENT: Negative for congestion and sore throat.   Eyes: Negative for visual disturbance.  Respiratory: Negative for cough, chest tightness, shortness of breath and wheezing.   Cardiovascular: Negative for chest pain.  Gastrointestinal: Positive for abdominal pain and nausea. Negative for constipation and vomiting.  Endocrine: Positive for polyuria.  Genitourinary: Positive for dysuria and flank pain. Negative for hematuria, vaginal bleeding and vaginal discharge.  Musculoskeletal: Positive for arthralgias and myalgias.  Neurological: Positive for weakness. Negative for dizziness and numbness.  A complete 10 system review of systems was obtained and all systems are negative except as noted in the HPI and PMH.     Physical Exam Updated Vital Signs BP 111/66 (BP Location: Left Arm)   Pulse 86   Temp 98.8 F (37.1 C) (Oral)   Resp 18   Ht 5\' 4"  (1.626 m)   Wt 130 lb (59 kg)   LMP 09/15/2015   SpO2 99%   BMI 22.31 kg/m   Physical Exam  Constitutional: She is oriented to person, place, and time. She appears well-developed and well-nourished. No distress.  Somnolent, arousable to voice  HENT:  Head: Normocephalic and atraumatic.  Mouth/Throat: Oropharynx is clear and moist. No oropharyngeal exudate.  Eyes: Conjunctivae and EOM are normal. Pupils are equal, round, and reactive to light.  Neck: Normal range of motion. Neck supple.  No meningismus.  Cardiovascular: Normal rate, regular rhythm, normal heart sounds and intact distal pulses.   No murmur heard. Pulmonary/Chest: Effort normal and breath sounds normal. No respiratory distress.  Abdominal: Soft. There is tenderness. There is no rebound and no guarding.  Diffuse abdominal tenderness worse in  the left side, no guarding or rebound  Musculoskeletal: Normal range of motion. She exhibits tenderness. She exhibits no edema.  Left CVA tenderness  Neurological: She is alert and oriented to person, place, and time. No cranial nerve deficit. She exhibits normal muscle tone. Coordination normal.   5/5 strength throughout. CN 2-12 intact.Equal grip strength.   Skin: Skin is warm.  Psychiatric: She has a normal mood and affect. Her behavior is normal.  Nursing note and vitals reviewed.    ED Treatments / Results  Labs (all labs ordered are listed, but only abnormal results are displayed) Labs Reviewed  URINALYSIS, ROUTINE W REFLEX MICROSCOPIC (NOT AT [facility]) - Abnormal; Notable for the following:       Result Value   Specific Gravity, Urine >1.030 (*)    All other components within normal  limits  CBC WITH DIFFERENTIAL/PLATELET - Abnormal; Notable for the following:    HCT 35.9 (*)    Lymphs Abs 4.3 (*)    Eosinophils Absolute 1.0 (*)    All other components within normal limits  COMPREHENSIVE METABOLIC PANEL - Abnormal; Notable for the following:    Glucose, Bld 108 (*)    Calcium 8.3 (*)    Total Protein 6.1 (*)    Albumin 3.3 (*)    Total Bilirubin 0.2 (*)    Anion gap 4 (*)    All other components within normal limits  URINE RAPID DRUG SCREEN, HOSP PERFORMED - Abnormal; Notable for the following:    Opiates POSITIVE (*)    Benzodiazepines POSITIVE (*)    Tetrahydrocannabinol POSITIVE (*)    All other components within normal limits  BLOOD GAS, VENOUS - Abnormal; Notable for the following:    pO2, Ven 57.7 (*)    Acid-Base Excess 4.3 (*)    All other components within normal limits  CBG MONITORING, ED - Abnormal; Notable for the following:    Glucose-Capillary 122 (*)    All other components within normal limits  MRSA PCR SCREENING  URINE CULTURE  PREGNANCY, URINE  LIPASE, BLOOD  AMMONIA    EKG  EKG Interpretation  Date/Time:  Thursday October 01 2015 19:59:57  EDT Ventricular Rate:  66 PR Interval:    QRS Duration: 104 QT Interval:  410 QTC Calculation: 430 R Axis:   74 Text Interpretation:  Sinus rhythm RSR' in V1 or V2, right VCD or RVH Baseline wander in lead(s) V2 No previous ECGs available Confirmed by Manus Gunning  MD, Bayan Kushnir [phone]) on 10/01/2015 8:07:06 PM       Radiology Ct Head Wo Contrast  Result Date: 10/01/2015 CLINICAL DATA:  Mental status changes today. EXAM: CT HEAD WITHOUT CONTRAST TECHNIQUE: Contiguous axial images were obtained from the base of the skull through the vertex without intravenous contrast. COMPARISON:  None. FINDINGS: Brain: The ventricles are normal in size and configuration. No extra-axial fluid collections are identified. The gray-Merrihew differentiation is normal. No CT findings for acute intracranial process such as hemorrhage or infarction. No mass lesions. The brainstem and cerebellum are grossly normal. Vascular: No vascular calcifications, aneurysm or hyperdense vessels. Skull: No skull fracture or bone lesion. Scattered benign dural calcifications. Sinuses/Orbits: Scattered ethmoid sinus disease and mild mucoperiosteal thickening involving both halves of the sphenoid sinus. The mastoid air cells and middle ear cavities are clear. Other: No scalp lesions or hematoma. IMPRESSION: Normal head CT. Electronically Signed   By: Rudie Meyer M.D.   On: 10/01/2015 19:19   Ct Renal Stone Study  Result Date: 10/01/2015 CLINICAL DATA:  Acute onset of generalized weakness and abdominal distention. Loss of appetite. Blurred vision and altered mental status. Initial encounter. EXAM: CT ABDOMEN AND PELVIS WITHOUT CONTRAST TECHNIQUE: Multidetector CT imaging of the abdomen and pelvis was performed following the standard protocol without IV contrast. COMPARISON:  CT of the abdomen and pelvis from 04/09/2015 FINDINGS: Lower chest: Mild bibasilar atelectasis noted. The lung bases are otherwise grossly clear. The visualized portions of  the mediastinum are unremarkable. Hepatobiliary: There appear to be diffuse gallbladder wall thickening, with fat-containing gallstones. This may reflect chronic inflammation, though acute cholecystitis cannot be excluded. The common bile duct is not well assessed, though likely normal in caliber. The liver is unremarkable in appearance. Pancreas: The pancreas is within normal limits. Spleen: The spleen is unremarkable in appearance. Adrenals/Urinary Tract: The adrenal glands are unremarkable  in appearance. The kidneys are within normal limits. There is no evidence of hydronephrosis. No renal or ureteral stones are identified. No perinephric stranding is seen. Stomach/Bowel: The stomach is unremarkable in appearance. The small bowel is within normal limits. The appendix is normal in caliber, without evidence of appendicitis. The colon is unremarkable in appearance. Vascular/Lymphatic: The abdominal aorta is unremarkable in appearance. The inferior vena cava is grossly unremarkable. No retroperitoneal lymphadenopathy is seen. No pelvic sidewall lymphadenopathy is identified. Reproductive: The bladder is mildly distended. Mild apparent bladder wall thickening is thought to reflect relative decompression. The uterus is grossly unremarkable. A 4.3 cm left adnexal cystic focus is noted, with asymmetric prominence of the left ovary. Adjacent trace fluid is likely physiologic in nature. Other: No additional soft tissue abnormalities are seen. Musculoskeletal: No additional soft tissue abnormalities are seen. IMPRESSION: 1. Diffuse gallbladder wall thickening, with fat containing gallstones. This may reflect chronic inflammation, though acute cholecystitis cannot be excluded. Would correlate with lab values. No evidence for obstruction. 2. 4.3 cm left adnexal cystic focus, with asymmetric prominence of the left ovary. Pelvic ultrasound is recommended for further evaluation, when and as deemed clinically appropriate. 3. Mild  bibasilar atelectasis noted.  Lungs otherwise grossly clear. Electronically Signed   By: Roanna Raider M.D.   On: 10/01/2015 19:40    Procedures Procedures (including critical care time)  Medications Ordered in ED Medications  sodium chloride 0.9 % bolus 1,000 mL (not administered)     Initial Impression / Assessment and Plan / ED Course  I have reviewed the triage vital signs and the nursing notes.  Pertinent labs & imaging results that were available during my care of the patient were reviewed by me and considered in my medical decision making (see chart for details).  Clinical Course  Patient presents with ongoing abdominal pain, dysuria, nausea, recent treatment for UTI and history of hepatitis C.  Labs are reassuring. No leukocytosis. Ammonia normal.  Patient remains difficult to wake up and obtunded. She rouses to voice but falls back asleep quickly. Family at bedside denies any prescription or illicit drug use. IV Narcan given  We'll add on CT head. llabs reassuring, UA negative.  No response to Narcan. Patient did awaken to ammonia inhalant. We'll check a VBG. Drug screen positive for opiates, benzodiazepines and marijuana.  CT head negative.  CT a/p shows gallbladder wal thickening, L ovarian cyst.  No CO2 retention on ABG.  Plan admission for ongoing encephalopathy, likely 2/2 drug use. Patient still obtunded but protecting airway. Additional narcan given  D/w Dr. Onalee Hua.    CRITICAL CARE Performed by: Glynn Octave Total critical care time: Critical care time was exclusive of separately billable procedures and treating other patients. Critical care was necessary to treat or prevent imminent or life-threatening deterioration. Critical care was time spent personally by me on the following activities: development of treatment plan with patient and/or surrogate as well as nursing, discussions with consultants, evaluation of patient's response to treatment,  examination of patient, obtaining history from patient or surrogate, ordering and performing treatments and interventions, ordering and review of laboratory studies, ordering and review of radiographic studies, pulse oximetry and re-evaluation of patient's condition.   Final Clinical Impressions(s) / ED Diagnoses   Final diagnoses:  Left flank pain  Acute encephalopathy  Gall stones    New Prescriptions New Prescriptions   No medications on file     Glynn Octave, MD 10/02/15 0127

## 2015-10-01 NOTE — ED Notes (Signed)
Pt placed on bedpan for appx 2 minutes with no success, pt taken off bedpan and in and out catheter attempted with no success. Pt placed back on bedpan and urine was produced and collected for sample.  Bedpan removed at this time. Pt alert and oriented.  Family present for all attempts.

## 2015-10-01 NOTE — Progress Notes (Signed)
*  PRELIMINARY RESULTS* Echocardiogram 2D Echocardiogram has been performed.  Stacey DrainWhite, Durand Wittmeyer J 10/01/2015, 2:00 PM

## 2015-10-01 NOTE — H&P (Signed)
History and Physical    Bufford LopeMargaret E Wamser ZOX:[id number] DOB: 03/20/1995 DOA: 10/01/2015  PCP: Cassell SmilesFUSCO,LAWRENCE J., MD  Patient coming from:  home  Chief Complaint: left sided pain  HPI: Bufford LopeMargaret E Hagwood is a 20 y.o. female with medical history significant of asthma, chronic hep c, has been given several rounds of antibiotics for over a month for utis.  She came to ed for left sided back pain that radiates to her lower left quadrant and into her pelvis.  She has h/o chronic pelvic pain and abdominal pain but this has been worse in the last week.  No fevers.  No n/v/d.  Pt [facility].  She reports she is undergoing evaluation for possible lupus but does not have an appt with rhuematology there until jan 2018.  She has seen GI there who told her her kidneys were so bad she could do the treatment for her hepatitis c.  Pt has pain pills at home and valiuum but denies taking any of this this am.  All of her work up in the ED is unrevealing for persistent infection, she is currently on keflex.  However she was very sedated in the ED, per dr rancour was barely arousable to voice, she was given several small doses of narcan to which she did not respond.  She was given another larger dose of 1mg  narcan prior to being sent to the stepdown unit (less than an hour ago) and now she is wide awake.  Pt referred for admission for her sedation.  She denies any vaginal discharge.    Review of Systems: As per HPI otherwise 10 point review of systems negative.   Past Medical History:  Diagnosis Date  . Asthma as a child  . Depression   . Double cervix   . Hepatitis C   . Liver disease     Past Surgical History:  Procedure Laterality Date  . PERINEAL LACERATION REPAIR  09/23/2011   Procedure: SUTURE REPAIR PERINEAL LACERATION;  Surgeon: Tilda BurrowJohn V Ferguson, MD;  Location: [facility];  Service: Gynecology;  Laterality: N/A;  Repair Obstetric Laceration  . TONSILLECTOMY    . UTERINE SEPTUM RESECTION   08/2010   [facility]  2 cervices, nl ut ext contours  . VAGINAL SEPTUM RESECTION  06/15/2010   [name], normal uterine contour external     reports that she has been smoking Cigarettes.  She started smoking about 12 years ago. She has a 10.00 pack-year smoking history. She has never used smokeless tobacco. She reports that she does not drink alcohol or use drugs.  Allergies  Allergen Reactions  . Amoxicillin     Really sick to stomach  . Onion Other (See Comments)    "Feels like throat is burning"  . Tylenol [Acetaminophen]     Stage 3 liver disease    Family History  Problem Relation Age of Onset  . Dermatomyositis Mother   . Lupus Sister     Prior to Admission medications   Medication Sig Start Date End Date Taking? Authorizing Provider  albuterol (PROVENTIL HFA) 108 (90 Base) MCG/ACT inhaler Inhale 2 puffs into the lungs every 6 (six) hours as needed for wheezing or shortness of breath.   Yes Historical Provider, MD  cephALEXin (KEFLEX) 500 MG capsule Take 500 mg by mouth 4 (four) times daily.   Yes Historical Provider, MD  citalopram (CELEXA) 20 MG tablet Take 20 mg by mouth daily.  Yes Historical Provider, MD  clobetasol (TEMOVATE) 0.05 % external solution Apply 1 application topically daily. 03/30/15  Yes Historical Provider, MD  diazepam (VALIUM) 10 MG tablet Take 10 mg by mouth 2 (two) times daily as needed for anxiety.    Yes Historical Provider, MD  gabapentin (NEURONTIN) 300 MG capsule Take 1 capsule (300 mg total) by mouth 3 (three) times daily. 04/18/14  Yes Tilda Burrow, MD  Ibuprofen (ADVIL MIGRAINE PO) Take by mouth 2 (two) times daily as needed.   Yes Historical Provider, MD  omeprazole (PRILOSEC) 40 MG capsule Take 40 mg by mouth daily as needed.   Yes Historical Provider, MD    Physical Exam: Vitals:   10/01/15 1900 10/01/15 1903 10/01/15 1930 10/01/15 2000  BP: 94/57 100/58 103/59 101/58  Pulse: 65 65 65 64  Resp: 13 13 14 13     Temp:      TempSrc:      SpO2: 94% 95% 100% 100%  Weight:      Height:          Constitutional: NAD, calm, comfortable Vitals:   10/01/15 1900 10/01/15 1903 10/01/15 1930 10/01/15 2000  BP: 94/57 100/58 103/59 101/58  Pulse: 65 65 65 64  Resp: 13 13 14 13   Temp:      TempSrc:      SpO2: 94% 95% 100% 100%  Weight:      Height:       Eyes: PERRL, lids and conjunctivae normal ENMT: Mucous membranes are moist. Posterior pharynx clear of any exudate or lesions.Normal dentition.  Neck: normal, supple, no masses, no thyromegaly Respiratory: clear to auscultation bilaterally, no wheezing, no crackles. Normal respiratory effort. No accessory muscle use.  Cardiovascular: Regular rate and rhythm, no murmurs / rubs / gallops. No extremity edema. 2+ pedal pulses. No carotid bruits.  Abdomen: no tenderness, no masses palpated. No hepatosplenomegaly. Bowel sounds positive.  Musculoskeletal: no clubbing / cyanosis. No joint deformity upper and lower extremities. Good ROM, no contractures. Normal muscle tone.  Skin: no rashes, lesions, ulcers. No induration Neurologic: CN 2-12 grossly intact. Sensation intact, DTR normal. Strength 5/5 in all 4.  Psychiatric: Normal judgment and insight. Alert and oriented x 3. Normal mood.    Labs on Admission: I have personally reviewed following labs and imaging studies  CBC:  Recent Labs Lab 10/01/15 1549  WBC 9.5  NEUTROABS 3.6  HGB 12.1  HCT 35.9*  MCV 86.5  PLT 199   Basic Metabolic Panel:  Recent Labs Lab 10/01/15 1549  NA 136  K 3.5  CL 104  CO2 28  GLUCOSE 108*  BUN 8  CREATININE 0.70  CALCIUM 8.3*   GFR: Estimated Creatinine Clearance: 95.3 mL/min (by C-G formula based on SCr of 0.7 mg/dL). Liver Function Tests:  Recent Labs Lab 10/01/15 1549  AST 33  ALT 54  ALKPHOS 61  BILITOT 0.2*  PROT 6.1*  ALBUMIN 3.3*    Recent Labs Lab 10/01/15 1549  LIPASE 12    Recent Labs Lab 10/01/15 1549  AMMONIA 27    CBG:  Recent Labs Lab 10/01/15 1532  GLUCAP 122*   Urine analysis:    Component Value Date/Time   COLORURINE YELLOW 10/01/2015 1510   APPEARANCEUR CLEAR 10/01/2015 1510   LABSPEC >1.030 (H) 10/01/2015 1510   PHURINE 5.5 10/01/2015 1510   GLUCOSEU NEGATIVE 10/01/2015 1510   HGBUR NEGATIVE 10/01/2015 1510   BILIRUBINUR NEGATIVE 10/01/2015 1510   KETONESUR NEGATIVE 10/01/2015 1510   PROTEINUR NEGATIVE 10/01/2015  1510   UROBILINOGEN 0.2 06/10/2010 1427   NITRITE NEGATIVE 10/01/2015 1510   LEUKOCYTESUR NEGATIVE 10/01/2015 1510   Radiological Exams on Admission: Ct Head Wo Contrast  Result Date: 10/01/2015 CLINICAL DATA:  Mental status changes today. EXAM: CT HEAD WITHOUT CONTRAST TECHNIQUE: Contiguous axial images were obtained from the base of the skull through the vertex without intravenous contrast. COMPARISON:  None. FINDINGS: Brain: The ventricles are normal in size and configuration. No extra-axial fluid collections are identified. The gray-Seyler differentiation is normal. No CT findings for acute intracranial process such as hemorrhage or infarction. No mass lesions. The brainstem and cerebellum are grossly normal. Vascular: No vascular calcifications, aneurysm or hyperdense vessels. Skull: No skull fracture or bone lesion. Scattered benign dural calcifications. Sinuses/Orbits: Scattered ethmoid sinus disease and mild mucoperiosteal thickening involving both halves of the sphenoid sinus. The mastoid air cells and middle ear cavities are clear. Other: No scalp lesions or hematoma. IMPRESSION: Normal head CT. Electronically Signed   By: Rudie Meyer M.D.   On: 10/01/2015 19:19   Ct Renal Stone Study  Result Date: 10/01/2015 CLINICAL DATA:  Acute onset of generalized weakness and abdominal distention. Loss of appetite. Blurred vision and altered mental status. Initial encounter. EXAM: CT ABDOMEN AND PELVIS WITHOUT CONTRAST TECHNIQUE: Multidetector CT imaging of the abdomen and  pelvis was performed following the standard protocol without IV contrast. COMPARISON:  CT of the abdomen and pelvis from 04/09/2015 FINDINGS: Lower chest: Mild bibasilar atelectasis noted. The lung bases are otherwise grossly clear. The visualized portions of the mediastinum are unremarkable. Hepatobiliary: There appear to be diffuse gallbladder wall thickening, with fat-containing gallstones. This may reflect chronic inflammation, though acute cholecystitis cannot be excluded. The common bile duct is not well assessed, though likely normal in caliber. The liver is unremarkable in appearance. Pancreas: The pancreas is within normal limits. Spleen: The spleen is unremarkable in appearance. Adrenals/Urinary Tract: The adrenal glands are unremarkable in appearance. The kidneys are within normal limits. There is no evidence of hydronephrosis. No renal or ureteral stones are identified. No perinephric stranding is seen. Stomach/Bowel: The stomach is unremarkable in appearance. The small bowel is within normal limits. The appendix is normal in caliber, without evidence of appendicitis. The colon is unremarkable in appearance. Vascular/Lymphatic: The abdominal aorta is unremarkable in appearance. The inferior vena cava is grossly unremarkable. No retroperitoneal lymphadenopathy is seen. No pelvic sidewall lymphadenopathy is identified. Reproductive: The bladder is mildly distended. Mild apparent bladder wall thickening is thought to reflect relative decompression. The uterus is grossly unremarkable. A 4.3 cm left adnexal cystic focus is noted, with asymmetric prominence of the left ovary. Adjacent trace fluid is likely physiologic in nature. Other: No additional soft tissue abnormalities are seen. Musculoskeletal: No additional soft tissue abnormalities are seen. IMPRESSION: 1. Diffuse gallbladder wall thickening, with fat containing gallstones. This may reflect chronic inflammation, though acute cholecystitis cannot be  excluded. Would correlate with lab values. No evidence for obstruction. 2. 4.3 cm left adnexal cystic focus, with asymmetric prominence of the left ovary. Pelvic ultrasound is recommended for further evaluation, when and as deemed clinically appropriate. 3. Mild bibasilar atelectasis noted.  Lungs otherwise grossly clear. Electronically Signed   By: Roanna Raider M.D.   On: 10/01/2015 19:40    Assessment/Plan 20 yo female with altered mental status now resolved after narcan  Principal Problem:   Acute encephalopathy- pt and family adamantly deny taking any controlled substances today.  She has had a normal ct head, ua is neg,  wbc is normal, she is afebrile.  Ct abd/pelvis does not show anything acute.  Lactic acid level is pending.  She has no pain on right side (ct shows some GB wall thickening), lfts and lipase are normal.  Suspect this may be drug related, due to response to narcan or due to neurontin??  Celexa??     Active Problems:   Chronic pelvic pain in female- will obtain pelvic ultrasound in the am   Abdominal pain, left lateral- noted   History of UTI- cont keflex   Hepatitis C- noted, amm level normal   Asthma childhood- veinous gas normal, no co2 retention   Pt and her family wishes for [place] to try to get an earlier appointment with [place] rheumatology office for follow up sooner than jan if possible.    DVT prophylaxis:  scds Code Status:   full Family Communication:  Mother and boyfriend  Disposition Plan:  Per day team Consults called:   none Admission status:   obs   Belanna Manring A MD [facility]  If 7PM-7AM, please contact night-coverage [email or web address] Password [email or web address]  10/01/2015, 8:40 PM

## 2015-10-01 NOTE — ED Notes (Signed)
Pt returned from CT °

## 2015-10-01 NOTE — ED Triage Notes (Signed)
Pt reports kidney infection for which she is being treated. Pt has hx of hep C, was placed on new treatment but is having decreasing kidney function. Pt reports weakness, abdominal distention and loss of appetite.

## 2015-10-02 NOTE — Discharge Summary (Signed)
Pt left AMA °

## 2015-10-03 LAB — URINE CULTURE: Culture: NO GROWTH

## 2015-10-05 ENCOUNTER — Ambulatory Visit: Payer: Medicaid Other | Admitting: Obstetrics and Gynecology

## 2015-10-06 ENCOUNTER — Telehealth: Payer: Self-pay | Admitting: Cardiology

## 2015-10-06 NOTE — Telephone Encounter (Signed)
Called patient back after she called after hours answering service. She told me that she used the MyChart application to look at the results of her Echo and was concerned that she had "lots of stuff wrong with her heart arteries".  After talking with her she was referring to the trivial regurgitation noted on her Echo. I explained to her that this was not a significant finding. Also explained to her about that she has a normal EF and no pericardial effusion. She tells me that she has "heart pain" every day. No associated symptoms. Her EKG is non ischemic. She last saw Dr. Purvis SheffieldKoneswaran last week and reported the same to him. He ordered Echo to rule out pericardial effusion (she has lupus) and to assess wall motion abnormality. Patient became slightly frustrated, but had no further questions.

## 2015-10-14 ENCOUNTER — Ambulatory Visit (INDEPENDENT_AMBULATORY_CARE_PROVIDER_SITE_OTHER): Payer: Medicaid Other | Admitting: Obstetrics and Gynecology

## 2015-10-14 ENCOUNTER — Encounter: Payer: Self-pay | Admitting: Obstetrics and Gynecology

## 2015-10-14 VITALS — BP 120/76 | HR 88 | Ht 63.0 in | Wt 139.2 lb

## 2015-10-14 DIAGNOSIS — Q51820 Cervical duplication: Secondary | ICD-10-CM | POA: Diagnosis not present

## 2015-10-14 DIAGNOSIS — G8929 Other chronic pain: Secondary | ICD-10-CM

## 2015-10-14 DIAGNOSIS — R102 Pelvic and perineal pain: Secondary | ICD-10-CM

## 2015-10-14 DIAGNOSIS — Q512 Other doubling of uterus, unspecified: Principal | ICD-10-CM

## 2015-10-14 DIAGNOSIS — Q5128 Other doubling of uterus, other specified: Secondary | ICD-10-CM

## 2015-10-14 NOTE — Progress Notes (Signed)
[facility] Visit  10/14/15           Patient name: Linda Spence MRN [id number]  Date of birth: 07-Oct-1995  CC & HPI:  Linda Spence is a 20 y.o. female presenting today for follow up for persistent, daily, unchanged pelvic pain. She describes her pain as stabbing nature. She is concerned if this pain is related to her prior surgeries (vaginal and uterine septum resection).   She reports being diagnosed with hep C in March 2017 with a viral load 200,000. She also reports multiple episodes of UTI and has been most recently been treated Keflex. She was recently hospitalized at [facility] on 10/01/15.    ROS:  ROS +pelvic pain, dyspareunia Otherwise negative, Hep C new dx.  Pertinent History Reviewed:   Reviewed: Significant for uterine septum resection, vaginal septum resection, lupus, Hep C, liver disease  Medical         Past Medical History:  Diagnosis Date  . Asthma as a child  . Depression   . Double cervix   . Hepatitis C   . Liver disease   . Lupus                               Surgical Hx:    Past Surgical History:  Procedure Laterality Date  . PERINEAL LACERATION REPAIR  09/23/2011   Procedure: SUTURE REPAIR PERINEAL LACERATION;  Surgeon: Tilda Burrow, MD;  Location: [facility];  Service: Gynecology;  Laterality: N/A;  Repair Obstetric Laceration  . TONSILLECTOMY    . UTERINE SEPTUM RESECTION  08/2010   [facility]  2 cervices, nl ut ext contours  . VAGINAL SEPTUM RESECTION  06/15/2010   [name], normal uterine contour external   Medications: Reviewed & Updated - see associated section                       Current Outpatient Prescriptions:  .  albuterol (PROVENTIL HFA) 108 (90 Base) MCG/ACT inhaler, Inhale 2 puffs into the lungs every 6 (six) hours as needed for wheezing or shortness of breath., Disp: , Rfl:  .  cephALEXin (KEFLEX) 500 MG capsule, Take 500 mg by mouth 4 (four) times daily., Disp: , Rfl:  .  clobetasol (TEMOVATE) 0.05 %  external solution, Apply 1 application topically daily., Disp: , Rfl:  .  diazepam (VALIUM) 10 MG tablet, Take 10 mg by mouth 2 (two) times daily as needed for anxiety. , Disp: , Rfl:  .  gabapentin (NEURONTIN) 300 MG capsule, Take 1 capsule (300 mg total) by mouth 3 (three) times daily., Disp: 90 capsule, Rfl: 1 .  Ibuprofen (ADVIL MIGRAINE PO), Take by mouth 2 (two) times daily as needed., Disp: , Rfl:  .  omeprazole (PRILOSEC) 40 MG capsule, Take 40 mg by mouth daily as needed., Disp: , Rfl:  .  citalopram (CELEXA) 20 MG tablet, Take 20 mg by mouth daily., Disp: , Rfl:    Social History: Reviewed -  reports that she has quit smoking. Her smoking use included Cigarettes. She started smoking about 12 years ago. She has a 10.00 pack-year smoking history. She has never used smokeless tobacco.  Objective Findings:  Vitals: Blood pressure 120/76, pulse 88, height 5\' 3"  (1.6 m), weight 139 lb 3.2 oz (63.1 kg), last menstrual period 10/14/2015.  Physical Examination: General appearance - alert, well appearing, and in no  distress and oriented to person, place, and time Pelvic - VULVA: normal appearing vulva with no masses, tenderness or lesions,  VAGINA: normal appearing vagina with normal color and discharge, no lesions, good posterior support CERVIX: Somewhat everted cervix, non-purulent, 4 cm transverse diameter UTERUS: uterus is normal size, shape, consistency and nontender, uterus is normal size, shape, consistency. Tender to contact,  ADNEXA: normal adnexa in size, nontender and no masses  Spent 25 min discussing patient's history and possibility of their relation to her pelvic pain. Also discussed possibility of future pregnancy and success rate after hep C treatment.   Assessment & Plan:   A:  1. Generalized pelvic sensitivity, especially uterine contact. No evidence of pelvic infection.   P:  1. Continue using gabapentin  2. Return PRN, [facility]    By signing my name below, I, [name], attest that this documentation has been prepared under the direction and in the presence of Tilda BurrowJohn V Chivonne Rascon, MD. Electronically Signed: [name], Neurosurgeoncribe. 10/14/15. 12:34 PM.  I personally performed the services described in this documentation, which was SCRIBED in my presence. The recorded information has been reviewed and considered accurate. It has been edited as necessary during review. Tilda BurrowFERGUSON,Ondria Oswald V, MD

## 2016-12-05 NOTE — Progress Notes (Signed)
Cardiology Office Note   Date:  12/06/2016   ID:  Linda LopeMargaret E Coopersmith, DOB 10/14/1995, MRN [id number]  PCP:  Elfredia NevinsFusco, Jessalyn Hinojosa, MD  Cardiologist:  Prentice DockerSuresh Koneswaran, MD Chief Complaint  Patient presents with  . Chest Pain     History of Present Illness: Linda Spence is a 21 y.o. female who presents for ongoing assessment and management of chest pain.  The patient was last seen by Dr. Purvis SheffieldKoneswaran on 09/25/2015.  At that time, 2D echocardiogram was ordered for evaluation of LV function.  She has a history of autoimmune disease and may have pericardial effusion or other inflammation.  Echocardiogram 10/01/2015 Left ventricle: The cavity size was normal. Wall thickness was   normal. Systolic function was normal. The estimated ejection   fraction was in the range of 60% to 65%. Wall motion was normal;   there were no regional wall motion abnormalities. Left   ventricular diastolic function parameters were normal. - Mitral valve: There was trivial regurgitation. - Right atrium: Central venous pressure (est): 3 mm Hg. - Tricuspid valve: There was trivial regurgitation. - Pulmonary arteries: Systolic pressure could not be accurately   estimated. - Pericardium, extracardiac: There was no pericardial effusion.  Impressions:  - Normal LV wall thickness with LVEF 60-65%. Normal diastolic   function. Trivial mitral and tricuspid regurgitation. No   pericardial effusion.  The patient is now followed [facility] [place], by rheumatologist.  She is also followed by oncologist.  She comes today with complaints of rapid heart rhythm and recurrent chest pain.  Patient states the chest pain occurs when she is exerting herself, she describes as dull and sharp.  Heart rate has increased with minimal exertion.  She often feels her heart rate racing.  Past Medical History:  Diagnosis Date  . Asthma as a child  . Depression   . Double cervix   . Hepatitis C   . Liver disease     . Lupus     Past Surgical History:  Procedure Laterality Date  . PERINEAL LACERATION REPAIR  09/23/2011   Procedure: SUTURE REPAIR PERINEAL LACERATION;  Surgeon: Tilda BurrowJohn V Ferguson, MD;  Location: [facility];  Service: Gynecology;  Laterality: N/A;  Repair Obstetric Laceration  . TONSILLECTOMY    . UTERINE SEPTUM RESECTION  08/2010   [facility]  2 cervices, nl ut ext contours  . VAGINAL SEPTUM RESECTION  06/15/2010   [name], normal uterine contour external     Current Outpatient Medications  Medication Sig Dispense Refill  . albuterol (PROVENTIL HFA) 108 (90 Base) MCG/ACT inhaler Inhale 2 puffs into the lungs every 6 (six) hours as needed for wheezing or shortness of breath.    . ALPRAZolam (XANAX) 1 MG tablet TAKE ONE TABLET BY MOUTH FOUR TIMES DAILY AS NEEDED    . clobetasol (TEMOVATE) 0.05 % external solution Apply 1 application topically daily.    . Ibuprofen (ADVIL MIGRAINE PO) Take by mouth 2 (two) times daily as needed.    Marland Kitchen. omeprazole (PRILOSEC) 40 MG capsule Take 40 mg by mouth daily as needed.     No current facility-administered medications for this visit.     Allergies:   Amoxicillin; Onion; and Tylenol [acetaminophen]    Social History:  The patient  reports that she has quit smoking. Her smoking use included cigarettes. She started smoking about 13 years ago. She has a 10.00 pack-year smoking history. she has never used smokeless tobacco. She reports that  she does not drink alcohol or use drugs.   Family History:  The patient's family history includes Dermatomyositis in her mother; Lupus in her sister.    ROS: All other systems are reviewed and negative. Unless otherwise mentioned in H&P    PHYSICAL EXAM: VS:  BP 110/70   Pulse 100   Ht 5\' 3"  (1.6 m)   Wt 137 lb (62.1 kg)   SpO2 99%   BMI 24.27 kg/m  , BMI Body mass index is 24.27 kg/m. GEN: Well nourished, well developed, in no acute distress  HEENT: normal  Neck: no JVD, carotid bruits,  or masses Cardiac: RRR; tachycardia, no murmurs, rubs, or gallops,no edema  Respiratory:  Clear to auscultation bilaterally, normal work of breathing GI: soft, nontender, nondistended, + BS MS: no deformity or atrophy  Skin: warm and dry, no rash Neuro:  Strength and sensation are intact Psych: euthymic mood, full affect   EKG: Normal sinus rhythm, heart rate of 83 bpm,  Recent Labs: No results found for requested labs within last 8760 hours.    Lipid Panel No results found for: CHOL, TRIG, HDL, CHOLHDL, VLDL, LDLCALC, LDLDIRECT    Wt Readings from Last 3 Encounters:  12/06/16 137 lb (62.1 kg)  10/14/15 139 lb 3.2 oz (63.1 kg)  10/01/15 144 lb 2.9 oz (65.4 kg)    ASSESSMENT AND PLAN:  1.  Palpitations: Often noted in lupus patients.  Echocardiogram was completed in 2017 and was not found to have thickened valvular abnormalities or LVH.  Will begin patient on low-dose metoprolol succinate, 12.5 mg at at bedtime.  She will follow-up with labs which will include a TSH, BMET, magnesium, and a CBC.  Consider repeating echo on next office visit if patient continues to be symptomatic.  Review of care everywhere has not shown any repeat echoes since most recent one completed through our office.  2. Chest Pain: Pain is usually associated with exertion, and shortness of breath.  EKG did not reveal any acute ST-T wave changes.  May consider nuclear medicine stress test once heart rate is better controlled.  3.  History of lupus: [facility].   Current medicines are reviewed at length with the patient today.    Labs/ tests ordered today include: BMET, CBC, magnesium, TSH.  Bettey MareKathryn M. Liborio NixonLawrence DNP, ANP, AACC   12/06/2016 3:19 PM    [facility] [address], [place], [place] [address] Phone: [phone]) [phone]; Fax: [phone]

## 2016-12-06 ENCOUNTER — Encounter: Payer: Self-pay | Admitting: Adult Health

## 2016-12-06 ENCOUNTER — Other Ambulatory Visit (HOSPITAL_COMMUNITY)
Admission: RE | Admit: 2016-12-06 | Discharge: 2016-12-06 | Disposition: A | Discharge status: Home / Self Care | Payer: Medicaid Other | Source: Ambulatory Visit | Attending: Adult Health | Admitting: Adult Health

## 2016-12-06 ENCOUNTER — Ambulatory Visit (INDEPENDENT_AMBULATORY_CARE_PROVIDER_SITE_OTHER): Payer: Medicaid Other | Admitting: Adult Health

## 2016-12-06 VITALS — BP 110/70 | HR 100 | Ht 63.0 in | Wt 137.0 lb

## 2016-12-06 DIAGNOSIS — R002 Palpitations: Secondary | ICD-10-CM | POA: Insufficient documentation

## 2016-12-06 DIAGNOSIS — R0789 Other chest pain: Secondary | ICD-10-CM | POA: Diagnosis not present

## 2016-12-06 LAB — BASIC METABOLIC PANEL
Anion gap: 8 (ref 5–15)
BUN: 10 mg/dL (ref 6–20)
CHLORIDE: 105 mmol/L (ref 101–111)
CO2: 26 mmol/L (ref 22–32)
CREATININE: 0.82 mg/dL (ref 0.44–1.00)
Calcium: 9.5 mg/dL (ref 8.9–10.3)
GFR calc Af Amer: >60 mL/min (ref >60)
GFR calc non Af Amer: >60 mL/min (ref >60)
GLUCOSE: 109 mg/dL — AB (ref 65–99)
POTASSIUM: 4 mmol/L (ref 3.5–5.1)
SODIUM: 139 mmol/L (ref 135–145)

## 2016-12-06 LAB — TSH: TSH: 0.18 u[IU]/mL — ABNORMAL LOW (ref 0.350–4.500)

## 2016-12-06 LAB — MAGNESIUM: MAGNESIUM: 2.2 mg/dL (ref 1.7–2.4)

## 2016-12-06 MED ORDER — METOPROLOL SUCCINATE ER 25 MG PO TB24: 12.5000 mg | ORAL_TABLET | Freq: Every day | ORAL | 3 refills | Status: DC

## 2016-12-06 NOTE — Patient Instructions (Signed)
Medication Instructions:  Your physician recommends that you continue on your current medications as directed. Please refer to the Current Medication list given to you today.  Start Toprol XL 12.5 mg Daily at Bedtime  Labwork: Your physician recommends that you return for lab work in: Today    Testing/Procedures: NONE   Follow-Up: Your physician recommends that you schedule a follow-up appointment in: 2 Weeks for Blood Pressure Check and EKG  Your physician recommends that you schedule a follow-up appointment in: 1 Month     Any Other Special Instructions Will Be Listed Below (If Applicable).     If you need a refill on your cardiac medications before your next appointment, please call your pharmacy.  Thank you for choosing [facility]!

## 2016-12-07 ENCOUNTER — Telehealth: Payer: Self-pay | Admitting: *Deleted

## 2016-12-07 NOTE — Telephone Encounter (Signed)
Called patient with test results. No answer. Left message to call back.  

## 2016-12-07 NOTE — Telephone Encounter (Signed)
-----   Message from Jodelle GrossKathryn M Lawrence, NP sent at 12/06/2016  5:06 PM EST ----- Labs are within normal limits without any significant abnormalities.  Still awaiting TSH.

## 2016-12-08 ENCOUNTER — Telehealth: Payer: Self-pay | Admitting: *Deleted

## 2016-12-08 NOTE — Telephone Encounter (Signed)
-----   Message from Jodelle GrossKathryn M Lawrence, NP sent at 12/08/2016  7:11 AM EST ----- TSH is abnormal indicating hyperthyroidism. Please send to PCP for further testing and management. This may be part of why her HR is fast at times.

## 2016-12-08 NOTE — Telephone Encounter (Signed)
Called patient with test results. No answer. Left message to call back.  

## 2016-12-21 ENCOUNTER — Ambulatory Visit (INDEPENDENT_AMBULATORY_CARE_PROVIDER_SITE_OTHER): Payer: Medicaid Other

## 2016-12-21 VITALS — BP 128/86 | HR 91

## 2016-12-21 DIAGNOSIS — R079 Chest pain, unspecified: Secondary | ICD-10-CM

## 2016-12-21 NOTE — Progress Notes (Signed)
Pt came in for blood pressure check and EKG. She stated that the metoprolol makes her tired but she is working through the side effects. She stated that she still has chest pains here and there but are never constant. She stated she gets lite headed on occasions and I advised her to stand up slowly when going from sitting to standing. She will keep Follow up with Dr. Purvis SheffieldKoneswaran in January.

## 2016-12-21 NOTE — Patient Instructions (Signed)
Medication Instructions:  Your physician recommends that you continue on your current medications as directed. Please refer to the Current Medication list given to you today.   Labwork: none  Testing/Procedures: none  Follow-Up: Your physician recommends that you schedule a follow-up appointment in: keep already scheduled follow up   Any Other Special Instructions Will Be Listed Below (If Applicable).     If you need a refill on your cardiac medications before your next appointment, please call your pharmacy.

## 2017-01-24 ENCOUNTER — Ambulatory Visit: Payer: Medicaid Other | Admitting: Cardiovascular Disease

## 2017-01-25 ENCOUNTER — Encounter: Payer: Self-pay | Admitting: Cardiovascular Disease

## 2017-05-08 ENCOUNTER — Emergency Department (HOSPITAL_COMMUNITY)
Admission: EM | Admit: 2017-05-08 | Discharge: 2017-05-08 | Disposition: A | Discharge status: Home / Self Care | Payer: Medicaid Other | Attending: Emergency Medicine | Admitting: Emergency Medicine

## 2017-05-08 ENCOUNTER — Emergency Department (HOSPITAL_COMMUNITY): Payer: Medicaid Other

## 2017-05-08 ENCOUNTER — Encounter (HOSPITAL_COMMUNITY): Payer: Self-pay | Admitting: Emergency Medicine

## 2017-05-08 ENCOUNTER — Other Ambulatory Visit: Payer: Self-pay

## 2017-05-08 DIAGNOSIS — R1084 Generalized abdominal pain: Secondary | ICD-10-CM | POA: Insufficient documentation

## 2017-05-08 DIAGNOSIS — Z79899 Other long term (current) drug therapy: Secondary | ICD-10-CM | POA: Insufficient documentation

## 2017-05-08 DIAGNOSIS — K921 Melena: Secondary | ICD-10-CM | POA: Insufficient documentation

## 2017-05-08 DIAGNOSIS — Z87891 Personal history of nicotine dependence: Secondary | ICD-10-CM | POA: Insufficient documentation

## 2017-05-08 DIAGNOSIS — J45909 Unspecified asthma, uncomplicated: Secondary | ICD-10-CM | POA: Diagnosis not present

## 2017-05-08 DIAGNOSIS — K5900 Constipation, unspecified: Secondary | ICD-10-CM | POA: Diagnosis not present

## 2017-05-08 HISTORY — DX: Bicornate uterus: Q51.3

## 2017-05-08 HISTORY — DX: Other chronic pain: G89.29

## 2017-05-08 HISTORY — DX: Pelvic and perineal pain: R10.2

## 2017-05-08 HISTORY — DX: Irritable bowel syndrome, unspecified: K58.9

## 2017-05-08 LAB — POC URINE PREG, ED: PREG TEST UR: NEGATIVE

## 2017-05-08 LAB — CBC WITH DIFFERENTIAL/PLATELET
BASOS PCT: 0 %
Basophils Absolute: 0.1 10*3/uL (ref 0.0–0.1)
EOS PCT: 1 %
Eosinophils Absolute: 0.1 10*3/uL (ref 0.0–0.7)
HCT: 43 % (ref 36.0–46.0)
Hemoglobin: 15 g/dL (ref 12.0–15.0)
Lymphocytes Relative: 24 %
Lymphs Abs: 3.3 10*3/uL (ref 0.7–4.0)
MCH: 28.7 pg (ref 26.0–34.0)
MCHC: 34.9 g/dL (ref 30.0–36.0)
MCV: 82.2 fL (ref 78.0–100.0)
MONO ABS: 0.4 10*3/uL (ref 0.1–1.0)
MONOS PCT: 3 %
NEUTROS PCT: 72 %
Neutro Abs: 10.1 10*3/uL — ABNORMAL HIGH (ref 1.7–7.7)
PLATELETS: 287 10*3/uL (ref 150–400)
RBC: 5.23 MIL/uL — ABNORMAL HIGH (ref 3.87–5.11)
RDW: 13.2 % (ref 11.5–15.5)
WBC: 14 10*3/uL — ABNORMAL HIGH (ref 4.0–10.5)

## 2017-05-08 LAB — URINALYSIS, ROUTINE W REFLEX MICROSCOPIC
Bacteria, UA: NONE SEEN
Bilirubin Urine: NEGATIVE
GLUCOSE, UA: NEGATIVE mg/dL
KETONES UR: NEGATIVE mg/dL
Leukocytes, UA: NEGATIVE
Nitrite: NEGATIVE
PH: 5 (ref 5.0–8.0)
Protein, ur: NEGATIVE mg/dL
Specific Gravity, Urine: 1.026 (ref 1.005–1.030)

## 2017-05-08 LAB — TYPE AND SCREEN
ABO/RH(D): O NEG
Antibody Screen: NEGATIVE

## 2017-05-08 LAB — COMPREHENSIVE METABOLIC PANEL
ALBUMIN: 4.3 g/dL (ref 3.5–5.0)
ALT: 13 U/L — ABNORMAL LOW (ref 14–54)
ANION GAP: 9 (ref 5–15)
AST: 18 U/L (ref 15–41)
Alkaline Phosphatase: 65 U/L (ref 38–126)
BILIRUBIN TOTAL: 0.6 mg/dL (ref 0.3–1.2)
BUN: 8 mg/dL (ref 6–20)
CHLORIDE: 103 mmol/L (ref 101–111)
CO2: 26 mmol/L (ref 22–32)
Calcium: 9.4 mg/dL (ref 8.9–10.3)
Creatinine, Ser: 0.75 mg/dL (ref 0.44–1.00)
GFR calc Af Amer: >60 mL/min (ref >60)
GFR calc non Af Amer: >60 mL/min (ref >60)
GLUCOSE: 144 mg/dL — AB (ref 65–99)
POTASSIUM: 3.5 mmol/L (ref 3.5–5.1)
SODIUM: 138 mmol/L (ref 135–145)
TOTAL PROTEIN: 7.3 g/dL (ref 6.5–8.1)

## 2017-05-08 LAB — POC OCCULT BLOOD, ED: FECAL OCCULT BLD: NEGATIVE

## 2017-05-08 LAB — HCG, SERUM, QUALITATIVE: PREG SERUM: NEGATIVE

## 2017-05-08 LAB — LIPASE, BLOOD: Lipase: 23 U/L (ref 11–51)

## 2017-05-08 MED ORDER — ONDANSETRON HCL 4 MG/2ML IJ SOLN
4.0000 mg | INTRAMUSCULAR | Status: DC | PRN
  Filled 2017-05-08: qty 2

## 2017-05-08 MED ORDER — DICYCLOMINE HCL 20 MG PO TABS: 20.0000 mg | ORAL_TABLET | Freq: Four times a day (QID) | ORAL | 0 refills | Status: DC | PRN

## 2017-05-08 MED ORDER — DICYCLOMINE HCL 10 MG/ML IM SOLN
20.0000 mg | Freq: Once | INTRAMUSCULAR | Status: AC
  Administered 2017-05-08: 20 mg via INTRAMUSCULAR
  Filled 2017-05-08: qty 2

## 2017-05-08 MED ORDER — IOPAMIDOL (ISOVUE-300) INJECTION 61%
100.0000 mL | Freq: Once | INTRAVENOUS | Status: AC | PRN
  Administered 2017-05-08: 100 mL via INTRAVENOUS

## 2017-05-08 MED ORDER — MORPHINE SULFATE (PF) 4 MG/ML IV SOLN
4.0000 mg | INTRAVENOUS | Status: AC | PRN
  Administered 2017-05-08 (×2): 4 mg via INTRAVENOUS
  Filled 2017-05-08 (×2): qty 1

## 2017-05-08 NOTE — ED Triage Notes (Signed)
Pt states being constipated "for months" and having four episodes of blood diarrhea with increasing lower abdominal pain.

## 2017-05-08 NOTE — ED Provider Notes (Signed)
[facility] EMERGENCY DEPARTMENT Provider Note   CSN: [id number] Arrival date & time: 05/08/17  1723     History   Chief Complaint Chief Complaint  Patient presents with  . Constipation    HPI Linda Spence is a 22 y.o. female.  HPI Pt was seen at 1815.  Per pt, c/o gradual onset and persistence of constant generalized abd "pain" since this morning. Has been associated with multiple BM's today, described as "watery" "with blood" today.  Pt states she has hx of IBS and has been constipated "for months." Pt states she "took a few sips" of mag citrate today PTA to the ED, but otherwise has not taken any medication to treat her constipation.  Describes the abd pain as "cramping."   Denies N/V, no fevers, no back pain, no rash, no CP/SOB, no black in stools, no rectal pain/injury.       Past Medical History:  Diagnosis Date  . Asthma as a child  . Bicornate uterus   . Chronic pelvic pain in female   . Depression   . Hepatitis C   . IBS (irritable bowel syndrome)   . Lupus [facility])     Patient Active Problem List   Diagnosis Date Noted  . Acute encephalopathy 10/01/2015  . Abdominal pain, left lateral 10/01/2015  . History of UTI 10/01/2015  . Chronic hepatitis C without hepatic coma (HCC)   . Asthma childhood   . Chronic pelvic pain in female 11/19/2014  . Dyspareunia, female 11/19/2014  . 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 Double cervix     Past Surgical History:  Procedure Laterality Date  . PERINEAL LACERATION REPAIR  09/23/2011   Procedure: SUTURE REPAIR PERINEAL LACERATION;  Surgeon: Tilda Burrow, MD;  Location: [facility];  Service: Gynecology;  Laterality: N/A;  Repair Obstetric Laceration  . TONSILLECTOMY    . UTERINE SEPTUM RESECTION  08/2010   [facility]  2 cervices, nl ut ext contours  . VAGINAL SEPTUM RESECTION  06/15/2010   [name], normal uterine contour external     OB History    Gravida  3   Para  1   Term  1   Preterm      AB  1   Living  1     SAB  1   TAB      Ectopic      Multiple      Live Births  1        Obstetric Comments  Septate uterus, with 2 cervices, normal external uterine contours, s/p resection of uterine longitudinal septum 08/2010, and s/p resection of longitudinal vaginal septum 06/15/2010 Cervix length of RIGHT cervix 3.3 cm on 05/09/2011         Home Medications    Prior to Admission medications   Medication Sig Start Date End Date Taking? Authorizing Provider  albuterol (PROVENTIL HFA) 108 (90 Base) MCG/ACT inhaler Inhale 2 puffs into the lungs every 6 (six) hours as needed for wheezing or shortness of breath.    [provider]  ALPRAZolam Prudy Feeler) 1 MG tablet TAKE ONE TABLET BY MOUTH FOUR TIMES DAILY AS NEEDED 02/21/16   [provider]  clobetasol (TEMOVATE) 0.05 % external solution Apply 1 application topically daily. 03/30/15   [provider]  Ibuprofen (ADVIL MIGRAINE PO) Take by mouth 2 (two) times daily as needed.    [provider]  metoprolol succinate (TOPROL XL) 25 MG 24 hr tablet Take 0.5  tablets (12.5 mg total) by mouth daily. 12/06/16   Jodelle Gross, NP  omeprazole (PRILOSEC) 40 MG capsule Take 40 mg by mouth daily as needed.    [provider]    Family History Family History  Problem Relation Age of Onset  . Dermatomyositis Mother   . Lupus Sister     Social History Social History   Tobacco Use  . Smoking status: Former Smoker    Packs/day: 1.00    Years: 10.00    Pack years: 10.00    Types: Cigarettes    Start date: 09/25/2003  . Smokeless tobacco: Never Used  Substance Use Topics  . Alcohol use: No    Comment: occ.  . Drug use: No    Types: Marijuana     Allergies   Amoxicillin; Onion; and Tylenol [acetaminophen]   Review of Systems Review of Systems ROS: Statement: All systems negative except as marked or noted in the HPI; Constitutional: Negative for fever and chills. ; ; Eyes:  Negative for eye pain, redness and discharge. ; ; ENMT: Negative for ear pain, hoarseness, nasal congestion, sinus pressure and sore throat. ; ; Cardiovascular: Negative for chest pain, palpitations, diaphoresis, dyspnea and peripheral edema. ; ; Respiratory: Negative for cough, wheezing and stridor. ; ; Gastrointestinal: +abd pain, blood in stool, chronic constipation. Negative for nausea, vomiting, hematemesis, jaundice.. ; ; Genitourinary: Negative for dysuria, flank pain and hematuria. ; ; Musculoskeletal: Negative for back pain and neck pain. Negative for swelling and trauma.; ; Skin: Negative for pruritus, rash, abrasions, blisters, bruising and skin lesion.; ; Neuro: Negative for headache, lightheadedness and neck stiffness. Negative for weakness, altered level of consciousness, altered mental status, extremity weakness, paresthesias, involuntary movement, seizure and syncope.      Physical Exam Updated Vital Signs BP 116/72   Pulse 76   Temp 97.7 F (36.5 C) (Oral)   Resp 17   Ht  (1.6 m)   Wt 61.7 kg (136 lb)   SpO2 99%   BMI 24.09 kg/m   Physical Exam 1820: Physical examination:  Nursing notes reviewed; Vital signs and O2 SAT reviewed;  Constitutional: Well developed, Well nourished, Well hydrated, In no acute distress; Head:  Normocephalic, atraumatic; Eyes: EOMI, PERRL, No scleral icterus; ENMT: Mouth and pharynx normal, Mucous membranes moist; Neck: Supple, Full range of motion, No lymphadenopathy; Cardiovascular: Regular rate and rhythm, No gallop; Respiratory: Breath sounds clear & equal bilaterally, No wheezes.  Speaking full sentences with ease, Normal respiratory effort/excursion; Chest: Nontender, Movement normal; Abdomen: Soft, +diffuse tenderness to palp. Nondistended, Normal bowel sounds. Rectal exam performed w/permission of pt and ED RN chaperone present.  Anal tone normal.  Non-tender, minimal soft brown stool in rectal vault, heme neg.  No fissures, no external  hemorrhoids, no palp masses.;;; Genitourinary: No CVA tenderness; Extremities: Peripheral pulses normal, No tenderness, No edema, No calf edema or asymmetry.; Neuro: AA&Ox3, Major CN grossly intact.  Speech clear. No gross focal motor or sensory deficits in extremities.; Skin: Color normal, Warm, Dry.   ED Treatments / Results  Labs (all labs ordered are listed, but only abnormal results are displayed)   EKG None  Radiology   Procedures Procedures (including critical care time)  Medications Ordered in ED Medications  morphine 4 MG/ML injection 4 mg (has no administration in time range)  ondansetron (ZOFRAN) injection 4 mg (has no administration in time range)     Initial Impression / Assessment and Plan / ED Course  I have reviewed  the triage vital signs and the nursing notes.  Pertinent labs & imaging results that were available during my care of the patient were reviewed by me and considered in my medical decision making (see chart for details).  MDM Reviewed: previous chart, nursing note and vitals Reviewed previous: labs Interpretation: labs and CT scan   Results for orders placed or performed during the hospital encounter of 05/08/17  Comprehensive metabolic panel  Result Value Ref Range   Sodium 138 135 - 145 mmol/L   Potassium 3.5 3.5 - 5.1 mmol/L   Chloride 103 101 - 111 mmol/L   CO2 26 22 - 32 mmol/L   Glucose, Bld 144 (H) 65 - 99 mg/dL   BUN 8 6 - 20 mg/dL   Creatinine, Ser 9.14 0.44 - 1.00 mg/dL   Calcium 9.4 8.9 - 78.2 mg/dL   Total Protein 7.3 6.5 - 8.1 g/dL   Albumin 4.3 3.5 - 5.0 g/dL   AST 18 15 - 41 U/L   ALT 13 (L) 14 - 54 U/L   Alkaline Phosphatase 65 38 - 126 U/L   Total Bilirubin 0.6 0.3 - 1.2 mg/dL   GFR calc non Af Amer >60 >60 mL/min   GFR calc Af Amer >60 >60 mL/min   Anion gap 9 5 - 15  hCG, serum, qualitative  Result Value Ref Range   Preg, Serum NEGATIVE NEGATIVE  Lipase, blood  Result Value Ref Range   Lipase 23 11 - 51 U/L    Urinalysis, Routine w reflex microscopic  Result Value Ref Range   Color, Urine YELLOW YELLOW   APPearance HAZY (A) CLEAR   Specific Gravity, Urine 1.026 1.005 - 1.030   pH 5.0 5.0 - 8.0   Glucose, UA NEGATIVE NEGATIVE mg/dL   Hgb urine dipstick SMALL (A) NEGATIVE   Bilirubin Urine NEGATIVE NEGATIVE   Ketones, ur NEGATIVE NEGATIVE mg/dL   Protein, ur NEGATIVE NEGATIVE mg/dL   Nitrite NEGATIVE NEGATIVE   Leukocytes, UA NEGATIVE NEGATIVE   RBC / HPF 0-5 0 - 5 RBC/hpf   WBC, UA 0-5 0 - 5 WBC/hpf   Bacteria, UA NONE SEEN NONE SEEN   Squamous Epithelial / LPF 0-5 0 - 5   Mucus PRESENT    Hyaline Casts, UA PRESENT   CBC with Differential/Platelet  Result Value Ref Range   WBC 14.0 (H) 4.0 - 10.5 K/uL   RBC 5.23 (H) 3.87 - 5.11 MIL/uL   Hemoglobin 15.0 12.0 - 15.0 g/dL   HCT 95.6 21.3 - 08.6 %   MCV 82.2 78.0 - 100.0 fL   MCH 28.7 26.0 - 34.0 pg   MCHC 34.9 30.0 - 36.0 g/dL   RDW 57.8 46.9 - 62.9 %   Platelets 287 150 - 400 K/uL   Neutrophils Relative % 72 %   Neutro Abs 10.1 (H) 1.7 - 7.7 K/uL   Lymphocytes Relative 24 %   Lymphs Abs 3.3 0.7 - 4.0 K/uL   Monocytes Relative 3 %   Monocytes Absolute 0.4 0.1 - 1.0 K/uL   Eosinophils Relative 1 %   Eosinophils Absolute 0.1 0.0 - 0.7 K/uL   Basophils Relative 0 %   Basophils Absolute 0.1 0.0 - 0.1 K/uL  POC occult blood, ED  Result Value Ref Range   Fecal Occult Bld NEGATIVE NEGATIVE  POC Urine Pregnancy, ED (do NOT order at [facility])  Result Value Ref Range   Preg Test, Ur NEGATIVE NEGATIVE  Type and screen [facility]  Result Value  Ref Range   ABO/RH(D) O NEG    Antibody Screen PENDING    Sample Expiration      05/11/2017 Performed at [facility], [address], [place], [place] [address]     Ct Abdomen Pelvis W Contrast Result Date: 05/08/2017 CLINICAL DATA:  22 year old female with chronic constipation and developing abdominal pain and bloody diarrhea. Additional medical history includes systemic lupus  erythematosus, hepatitis-C and bicornuate uterus. EXAM: CT ABDOMEN AND PELVIS WITH CONTRAST TECHNIQUE: Multidetector CT imaging of the abdomen and pelvis was performed using the standard protocol following bolus administration of intravenous contrast. CONTRAST:  ISOVUE-300 IOPAMIDOL (ISOVUE-300) INJECTION 61% COMPARISON:  Prior CT scan of the abdomen and pelvis 10/01/2015 FINDINGS: Lower chest: The lung bases are clear. Visualized cardiac structures are within normal limits for size. No pericardial effusion. Unremarkable visualized distal thoracic esophagus. Hepatobiliary: Geographic hypoattenuation in the left hemi-liver adjacent to the fissure for the falciform ligament is nonspecific but most suggestive of benign focal fatty infiltration. Normal hepatic contour and morphology. No discrete hepatic lesion. Multiple foci of gas are present within the gallbladder lumen. This almost certainly represents nitrogen trapped of thin noncalcified cholelithiasis. No intra or extrahepatic biliary ductal dilatation. Pancreas: Unremarkable. No pancreatic ductal dilatation or surrounding inflammatory changes. Spleen: Normal in size without focal abnormality. Adrenals/Urinary Tract: Adrenal glands are unremarkable. Kidneys are normal, without renal calculi, focal lesion, or hydronephrosis. Bladder is unremarkable. Stomach/Bowel: No evidence of obstruction or focal bowel wall thickening. Normal appendix in the right lower quadrant. The terminal ileum is unremarkable. Vascular/Lymphatic: No significant vascular findings are present. No enlarged abdominal or pelvic lymph nodes. Reproductive: Uterus and bilateral adnexa are unremarkable. Other: No abdominal wall hernia or abnormality. Mild free fluid in the pelvis is likely physiologic. Musculoskeletal: No acute fracture or aggressive appearing lytic or blastic osseous lesion. IMPRESSION: 1. No acute abnormality within the abdomen or pelvis. 2. There is no significant colonic  stool burden to suggest constipation. 3. Geographic hypoattenuation in the left hemi-liver adjacent to the fissure for the falciform ligament is nonspecific but most suggestive of benign focal fatty infiltration. 4. Cholelithiasis. Electronically Signed   By: Malachy Moan M.D.   On: 05/08/2017 20:12     2045:  Pt has tol PO well while in the ED without N/V.  No stooling while in the ED.  VSS. Feels better, though will have intermittent "spasms;" will dose IM bentyl. Pt states she is ready to go home now. Tx symptomatically, f/u GI MD. Dx and testing d/w pt and family.  Questions answered.  Verb understanding, agreeable to d/c home with outpt f/u.      Final Clinical Impressions(s) / ED Diagnoses   Final diagnoses:  None    ED Discharge Orders    None       Samuel Jester, DO 05/12/17 [id number]

## 2017-05-08 NOTE — Discharge Instructions (Signed)
Take the prescription as directed.  Call your regular medical doctor and your GI doctor tomorrow to schedule a follow up appointment this week.  Return to the Emergency Department immediately sooner if worsening.

## 2017-05-08 NOTE — ED Notes (Signed)
Informed pt of need for urine specimen. °

## 2017-05-08 NOTE — ED Notes (Signed)
Hemocult card placed at bedside for EDP

## 2017-07-12 ENCOUNTER — Encounter: Payer: Self-pay | Admitting: Adult Health

## 2017-07-12 ENCOUNTER — Encounter: Payer: Self-pay | Admitting: *Deleted

## 2017-07-12 ENCOUNTER — Other Ambulatory Visit: Payer: Self-pay

## 2017-07-12 ENCOUNTER — Ambulatory Visit: Payer: Medicaid Other | Admitting: Adult Health

## 2017-07-12 VITALS — BP 127/82 | HR 68 | Ht 63.0 in | Wt 135.0 lb

## 2017-07-12 DIAGNOSIS — N926 Irregular menstruation, unspecified: Secondary | ICD-10-CM | POA: Diagnosis not present

## 2017-07-12 DIAGNOSIS — Z3202 Encounter for pregnancy test, result negative: Secondary | ICD-10-CM | POA: Insufficient documentation

## 2017-07-12 DIAGNOSIS — R110 Nausea: Secondary | ICD-10-CM | POA: Diagnosis not present

## 2017-07-12 LAB — POCT URINE PREGNANCY: PREG TEST UR: NEGATIVE

## 2017-07-12 NOTE — Progress Notes (Signed)
  Subjective:     Patient ID: Linda Spence, female   DOB: 04/21/1995, 22 y.o.   MRN: [id number]  HPI Linda Spence is a 22 year old [place] female in for UPT, she had missed a period, and had 1+HPT 07/06/17 then bleed on day on 7/6, she says periods not regular, due to her uterus have septum and 2 cervix.She has systemic lupus and is taking plaquenil. She is not on any birth control.   Review of Systems +missed period, had 1+HPT(periods not regular) +nausea +stomach feels funny +breast tenderness  Reviewed past medical,surgical, social and family history. Reviewed medications and allergies.     Objective:   Physical Exam BP 127/82 (BP Location: Left Arm, Patient Position: Sitting, Cuff Size: Normal)   Pulse 68   Ht 5\' 3"  (1.6 m)   Wt 135 lb (61.2 kg)   LMP 07/08/2017   BMI 23.91 kg/m UPT negative. Skin warm and dry. Neck: mid line trachea, normal thyroid, good ROM, no lymphadenopathy noted. Lungs: clear to ausculation bilaterally. Cardiovascular: regular rate and rhythm. Will check QHCG and get back in for pap and physical in 2 weeks if not pregnant.    Assessment:     1. Missed period   2. Nausea   3. Pregnancy examination or test, negative result       Plan:     [facility] Return in 2 weeks for pap and physical

## 2017-07-13 LAB — BETA HCG QUANT (REF LAB)

## 2017-07-27 ENCOUNTER — Other Ambulatory Visit: Payer: Medicaid Other | Admitting: Obstetrics and Gynecology

## 2017-08-10 ENCOUNTER — Other Ambulatory Visit: Payer: Medicaid Other | Admitting: Obstetrics and Gynecology

## 2019-06-29 ENCOUNTER — Inpatient Hospital Stay (HOSPITAL_COMMUNITY)
Admission: EM | Admit: 2019-06-29 | Discharge: 2019-07-02 | DRG: 446 | Disposition: A | Discharge status: Home / Self Care | Payer: Medicaid Other | Attending: Internal Medicine | Admitting: Internal Medicine

## 2019-06-29 ENCOUNTER — Other Ambulatory Visit: Payer: Self-pay

## 2019-06-29 ENCOUNTER — Encounter (HOSPITAL_COMMUNITY): Payer: Self-pay | Admitting: Emergency Medicine

## 2019-06-29 DIAGNOSIS — Z79899 Other long term (current) drug therapy: Secondary | ICD-10-CM

## 2019-06-29 DIAGNOSIS — Z800 Family history of malignant neoplasm of digestive organs: Secondary | ICD-10-CM

## 2019-06-29 DIAGNOSIS — Z888 Allergy status to other drugs, medicaments and biological substances status: Secondary | ICD-10-CM

## 2019-06-29 DIAGNOSIS — Z91018 Allergy to other foods: Secondary | ICD-10-CM

## 2019-06-29 DIAGNOSIS — F1510 Other stimulant abuse, uncomplicated: Secondary | ICD-10-CM

## 2019-06-29 DIAGNOSIS — F1310 Sedative, hypnotic or anxiolytic abuse, uncomplicated: Secondary | ICD-10-CM | POA: Diagnosis present

## 2019-06-29 DIAGNOSIS — K589 Irritable bowel syndrome without diarrhea: Secondary | ICD-10-CM | POA: Diagnosis present

## 2019-06-29 DIAGNOSIS — F17210 Nicotine dependence, cigarettes, uncomplicated: Secondary | ICD-10-CM | POA: Diagnosis present

## 2019-06-29 DIAGNOSIS — F1410 Cocaine abuse, uncomplicated: Secondary | ICD-10-CM | POA: Diagnosis present

## 2019-06-29 DIAGNOSIS — Z880 Allergy status to penicillin: Secondary | ICD-10-CM

## 2019-06-29 DIAGNOSIS — F1910 Other psychoactive substance abuse, uncomplicated: Secondary | ICD-10-CM

## 2019-06-29 DIAGNOSIS — K8000 Calculus of gallbladder with acute cholecystitis without obstruction: Principal | ICD-10-CM

## 2019-06-29 DIAGNOSIS — Z832 Family history of diseases of the blood and blood-forming organs and certain disorders involving the immune mechanism: Secondary | ICD-10-CM

## 2019-06-29 DIAGNOSIS — B182 Chronic viral hepatitis C: Secondary | ICD-10-CM | POA: Diagnosis present

## 2019-06-29 DIAGNOSIS — K819 Cholecystitis, unspecified: Secondary | ICD-10-CM | POA: Diagnosis present

## 2019-06-29 DIAGNOSIS — Z20822 Contact with and (suspected) exposure to covid-19: Secondary | ICD-10-CM | POA: Diagnosis present

## 2019-06-29 DIAGNOSIS — Z820 Family history of epilepsy and other diseases of the nervous system: Secondary | ICD-10-CM

## 2019-06-29 DIAGNOSIS — M329 Systemic lupus erythematosus, unspecified: Secondary | ICD-10-CM | POA: Diagnosis present

## 2019-06-29 DIAGNOSIS — K5900 Constipation, unspecified: Secondary | ICD-10-CM | POA: Diagnosis present

## 2019-06-29 LAB — BASIC METABOLIC PANEL
Anion gap: 9 (ref 5–15)
BUN: 6 mg/dL (ref 6–20)
CO2: 25 mmol/L (ref 22–32)
Calcium: 9.2 mg/dL (ref 8.9–10.3)
Chloride: 99 mmol/L (ref 98–111)
Creatinine, Ser: 0.56 mg/dL (ref 0.44–1.00)
GFR calc Af Amer: >60 mL/min (ref >60)
GFR calc non Af Amer: >60 mL/min (ref >60)
Glucose, Bld: 105 mg/dL — ABNORMAL HIGH (ref 70–99)
Potassium: 4.5 mmol/L (ref 3.5–5.1)
Sodium: 133 mmol/L — ABNORMAL LOW (ref 135–145)

## 2019-06-29 LAB — CBC
HCT: 45.7 % (ref 36.0–46.0)
Hemoglobin: 15.3 g/dL — ABNORMAL HIGH (ref 12.0–15.0)
MCH: 27 pg (ref 26.0–34.0)
MCHC: 33.5 g/dL (ref 30.0–36.0)
MCV: 80.7 fL (ref 80.0–100.0)
Platelets: 268 10*3/uL (ref 150–400)
RBC: 5.66 MIL/uL — ABNORMAL HIGH (ref 3.87–5.11)
RDW: 12.8 % (ref 11.5–15.5)
WBC: 15.5 10*3/uL — ABNORMAL HIGH (ref 4.0–10.5)
nRBC: 0 % (ref 0.0–0.2)

## 2019-06-29 LAB — LIPASE, BLOOD: Lipase: 17 U/L (ref 11–51)

## 2019-06-29 MED ORDER — ONDANSETRON HCL 4 MG PO TABS
4.0000 mg | ORAL_TABLET | Freq: Once | ORAL | Status: AC
  Administered 2019-06-29: 4 mg via ORAL
  Filled 2019-06-29: qty 1

## 2019-06-29 NOTE — ED Notes (Signed)
Patient is having active vomiting. Zofran ordered.

## 2019-06-29 NOTE — ED Triage Notes (Signed)
Patient states abdominal pain x 3 days. Patient states severe pain. Patient also complains of nausea.

## 2019-06-30 ENCOUNTER — Emergency Department (HOSPITAL_COMMUNITY): Payer: Medicaid Other

## 2019-06-30 ENCOUNTER — Inpatient Hospital Stay (HOSPITAL_COMMUNITY): Payer: Medicaid Other

## 2019-06-30 DIAGNOSIS — Z91018 Allergy to other foods: Secondary | ICD-10-CM | POA: Diagnosis not present

## 2019-06-30 DIAGNOSIS — Z880 Allergy status to penicillin: Secondary | ICD-10-CM | POA: Diagnosis not present

## 2019-06-30 DIAGNOSIS — Z800 Family history of malignant neoplasm of digestive organs: Secondary | ICD-10-CM | POA: Diagnosis not present

## 2019-06-30 DIAGNOSIS — F1510 Other stimulant abuse, uncomplicated: Secondary | ICD-10-CM | POA: Diagnosis present

## 2019-06-30 DIAGNOSIS — Z888 Allergy status to other drugs, medicaments and biological substances status: Secondary | ICD-10-CM | POA: Diagnosis not present

## 2019-06-30 DIAGNOSIS — F17210 Nicotine dependence, cigarettes, uncomplicated: Secondary | ICD-10-CM | POA: Diagnosis present

## 2019-06-30 DIAGNOSIS — K8000 Calculus of gallbladder with acute cholecystitis without obstruction: Secondary | ICD-10-CM | POA: Diagnosis not present

## 2019-06-30 DIAGNOSIS — K5900 Constipation, unspecified: Secondary | ICD-10-CM | POA: Diagnosis present

## 2019-06-30 DIAGNOSIS — K589 Irritable bowel syndrome without diarrhea: Secondary | ICD-10-CM | POA: Diagnosis present

## 2019-06-30 DIAGNOSIS — Z79899 Other long term (current) drug therapy: Secondary | ICD-10-CM | POA: Diagnosis not present

## 2019-06-30 DIAGNOSIS — M329 Systemic lupus erythematosus, unspecified: Secondary | ICD-10-CM | POA: Diagnosis present

## 2019-06-30 DIAGNOSIS — B182 Chronic viral hepatitis C: Secondary | ICD-10-CM

## 2019-06-30 DIAGNOSIS — K819 Cholecystitis, unspecified: Secondary | ICD-10-CM | POA: Diagnosis present

## 2019-06-30 DIAGNOSIS — F1310 Sedative, hypnotic or anxiolytic abuse, uncomplicated: Secondary | ICD-10-CM

## 2019-06-30 DIAGNOSIS — F1410 Cocaine abuse, uncomplicated: Secondary | ICD-10-CM

## 2019-06-30 DIAGNOSIS — Z832 Family history of diseases of the blood and blood-forming organs and certain disorders involving the immune mechanism: Secondary | ICD-10-CM | POA: Diagnosis not present

## 2019-06-30 DIAGNOSIS — Z820 Family history of epilepsy and other diseases of the nervous system: Secondary | ICD-10-CM | POA: Diagnosis not present

## 2019-06-30 DIAGNOSIS — F1910 Other psychoactive substance abuse, uncomplicated: Secondary | ICD-10-CM | POA: Diagnosis present

## 2019-06-30 DIAGNOSIS — Z20822 Contact with and (suspected) exposure to covid-19: Secondary | ICD-10-CM | POA: Diagnosis present

## 2019-06-30 LAB — RAPID URINE DRUG SCREEN, HOSP PERFORMED
Amphetamines: POSITIVE — AB
Barbiturates: NOT DETECTED
Benzodiazepines: POSITIVE — AB
Cocaine: POSITIVE — AB
Opiates: POSITIVE — AB
Tetrahydrocannabinol: POSITIVE — AB

## 2019-06-30 LAB — URINALYSIS, ROUTINE W REFLEX MICROSCOPIC
Bilirubin Urine: NEGATIVE
Glucose, UA: NEGATIVE mg/dL
Ketones, ur: NEGATIVE mg/dL
Nitrite: NEGATIVE
Protein, ur: 30 mg/dL — AB
Specific Gravity, Urine: 1.018 (ref 1.005–1.030)
pH: 8 (ref 5.0–8.0)

## 2019-06-30 LAB — HCG, QUANTITATIVE, PREGNANCY: hCG, Beta Chain, Quant, S: 1 m[IU]/mL (ref <5)

## 2019-06-30 LAB — HEPATIC FUNCTION PANEL
ALT: 41 U/L (ref 0–44)
AST: 31 U/L (ref 15–41)
Albumin: 4.1 g/dL (ref 3.5–5.0)
Alkaline Phosphatase: 99 U/L (ref 38–126)
Bilirubin, Direct: 0.1 mg/dL (ref 0.0–0.2)
Indirect Bilirubin: 0.3 mg/dL (ref 0.3–0.9)
Total Bilirubin: 0.4 mg/dL (ref 0.3–1.2)
Total Protein: 7.6 g/dL (ref 6.5–8.1)

## 2019-06-30 LAB — PREGNANCY, URINE: Preg Test, Ur: NEGATIVE

## 2019-06-30 LAB — SARS CORONAVIRUS 2 BY RT PCR (HOSPITAL ORDER, PERFORMED IN ~~LOC~~ HOSPITAL LAB): SARS Coronavirus 2: NEGATIVE

## 2019-06-30 MED ORDER — SODIUM CHLORIDE 0.9 % IV BOLUS
500.0000 mL | Freq: Once | INTRAVENOUS | Status: AC
  Administered 2019-06-30: 500 mL via INTRAVENOUS

## 2019-06-30 MED ORDER — ALPRAZOLAM 1 MG PO TABS
1.0000 mg | ORAL_TABLET | Freq: Four times a day (QID) | ORAL | Status: DC | PRN
  Administered 2019-06-30 – 2019-07-02 (×8): 1 mg via ORAL
  Filled 2019-06-30 (×9): qty 1

## 2019-06-30 MED ORDER — ONDANSETRON HCL 4 MG/2ML IJ SOLN
4.0000 mg | Freq: Once | INTRAMUSCULAR | Status: AC
  Administered 2019-06-30: 4 mg via INTRAVENOUS
  Filled 2019-06-30: qty 2

## 2019-06-30 MED ORDER — SODIUM CHLORIDE 0.9 % IV SOLN: INTRAVENOUS | Status: DC

## 2019-06-30 MED ORDER — ACETAMINOPHEN 325 MG PO TABS: 650.0000 mg | ORAL_TABLET | Freq: Four times a day (QID) | ORAL | Status: DC | PRN

## 2019-06-30 MED ORDER — IOHEXOL 9 MG/ML PO SOLN
ORAL | Status: AC
  Filled 2019-06-30: qty 1000

## 2019-06-30 MED ORDER — ENOXAPARIN SODIUM 40 MG/0.4ML ~~LOC~~ SOLN
40.0000 mg | SUBCUTANEOUS | Status: DC
  Administered 2019-06-30: 40 mg via SUBCUTANEOUS
  Filled 2019-06-30: qty 0.4

## 2019-06-30 MED ORDER — FENTANYL CITRATE (PF) 100 MCG/2ML IJ SOLN
50.0000 ug | Freq: Once | INTRAMUSCULAR | Status: AC
  Administered 2019-06-30: 50 ug via INTRAVENOUS
  Filled 2019-06-30: qty 2

## 2019-06-30 MED ORDER — HYDROXYCHLOROQUINE SULFATE 200 MG PO TABS
300.0000 mg | ORAL_TABLET | Freq: Every day | ORAL | Status: DC
  Administered 2019-06-30 – 2019-07-02 (×3): 300 mg via ORAL
  Filled 2019-06-30 (×3): qty 2

## 2019-06-30 MED ORDER — ONDANSETRON HCL 4 MG/2ML IJ SOLN
4.0000 mg | Freq: Four times a day (QID) | INTRAMUSCULAR | Status: DC | PRN
  Administered 2019-06-30 (×3): 4 mg via INTRAVENOUS
  Filled 2019-06-30 (×3): qty 2

## 2019-06-30 MED ORDER — SODIUM CHLORIDE 0.9 % IV BOLUS
1000.0000 mL | Freq: Once | INTRAVENOUS | Status: AC
  Administered 2019-06-30: 1000 mL via INTRAVENOUS

## 2019-06-30 MED ORDER — OXYCODONE HCL 5 MG PO TABS
5.0000 mg | ORAL_TABLET | ORAL | Status: DC | PRN
  Administered 2019-07-01: 10 mg via ORAL
  Administered 2019-07-02: 5 mg via ORAL
  Administered 2019-07-02 (×3): 10 mg via ORAL
  Filled 2019-06-30 (×2): qty 2
  Filled 2019-06-30: qty 1
  Filled 2019-06-30 (×2): qty 2
  Filled 2019-06-30: qty 1

## 2019-06-30 MED ORDER — CIPROFLOXACIN IN D5W 400 MG/200ML IV SOLN
400.0000 mg | Freq: Two times a day (BID) | INTRAVENOUS | Status: DC
  Administered 2019-06-30 – 2019-07-02 (×4): 400 mg via INTRAVENOUS
  Filled 2019-06-30 (×4): qty 200

## 2019-06-30 MED ORDER — METRONIDAZOLE IN NACL 5-0.79 MG/ML-% IV SOLN
500.0000 mg | Freq: Once | INTRAVENOUS | Status: AC
  Administered 2019-06-30: 500 mg via INTRAVENOUS
  Filled 2019-06-30: qty 100

## 2019-06-30 MED ORDER — PANTOPRAZOLE SODIUM 40 MG IV SOLR
40.0000 mg | INTRAVENOUS | Status: DC
  Administered 2019-06-30 – 2019-07-02 (×3): 40 mg via INTRAVENOUS
  Filled 2019-06-30 (×3): qty 40

## 2019-06-30 MED ORDER — CIPROFLOXACIN IN D5W 400 MG/200ML IV SOLN
400.0000 mg | Freq: Once | INTRAVENOUS | Status: AC
  Administered 2019-06-30: 400 mg via INTRAVENOUS
  Filled 2019-06-30: qty 200

## 2019-06-30 MED ORDER — ACETAMINOPHEN 650 MG RE SUPP: 650.0000 mg | Freq: Four times a day (QID) | RECTAL | Status: DC | PRN

## 2019-06-30 MED ORDER — METRONIDAZOLE IN NACL 5-0.79 MG/ML-% IV SOLN
500.0000 mg | Freq: Three times a day (TID) | INTRAVENOUS | Status: DC
  Administered 2019-06-30 – 2019-07-02 (×6): 500 mg via INTRAVENOUS
  Filled 2019-06-30 (×7): qty 100

## 2019-06-30 MED ORDER — HYDROMORPHONE HCL 1 MG/ML IJ SOLN
1.0000 mg | INTRAMUSCULAR | Status: DC | PRN
  Administered 2019-06-30 – 2019-07-01 (×8): 1 mg via INTRAVENOUS
  Filled 2019-06-30 (×9): qty 1

## 2019-06-30 MED ORDER — KETOROLAC TROMETHAMINE 30 MG/ML IJ SOLN
30.0000 mg | Freq: Four times a day (QID) | INTRAMUSCULAR | Status: AC
  Administered 2019-06-30 – 2019-07-02 (×8): 30 mg via INTRAVENOUS
  Filled 2019-06-30 (×8): qty 1

## 2019-06-30 NOTE — H&P (Signed)
History and Physical    Linda Spence WIO:[id number] DOB: Jan 27, 1995 DOA: 06/29/2019  PCP: Redmond School, MD  Patient coming from: Home  I have personally briefly reviewed patient's old medical records in [place]  Chief Complaint: Abdominal pain  HPI: Linda Spence is a 24 y.o. female with medical history significant of lupus on Plaquenil, hepatitis C, is brought to the hospital with abdominal pain, nausea and vomiting.  She reports having the symptoms for the last few days.  She is unsure whether she has had any fevers.  She describes substernal chest pain that is burning type and worse with vomiting.  Vomitus is green in color.  She is not had any diarrhea.  Abdominal pain is worse with p.o. intake. She He was evaluated in the emergency room where LFTs were noted to be normal.  WBC count elevated at 15,000.  CT scan of the abdomen pelvis was performed that showed cholelithiasis and gallbladder wall thickening.  She subsequently had a abdominal ultrasound that showed positive Murphy sign, cholelithiasis, sludge and gallbladder wall thickening.  Urine toxicology screen is positive for benzodiazepines, opiates, cannabis, amphetamines, and cocaine.  She was referred for admission.   Review of Systems: As per HPI otherwise 10 point review of systems negative.    Past Medical History:  Diagnosis Date  . Asthma as a child  . 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 Bicornate uterus   . Chronic pelvic pain in female   . Depression   . Hepatitis C   . IBS (irritable bowel syndrome)   . Lupus [facility])     Past Surgical History:  Procedure Laterality Date  . PERINEAL LACERATION REPAIR  09/23/2011   Procedure: SUTURE REPAIR PERINEAL LACERATION;  Surgeon: Jonnie Kind, MD;  Location: [facility];  Service: Gynecology;  Laterality: N/A;  Repair Obstetric Laceration  . TONSILLECTOMY    . UTERINE SEPTUM RESECTION  08/2010   [facility]  2 cervices, nl ut ext contours  . VAGINAL SEPTUM  RESECTION  06/15/2010   [facility] -- [name], normal uterine contour external    Social History:  reports that she has been smoking cigarettes. She started smoking about 15 years ago. She has a 2.50 pack-year smoking history. She has never used smokeless tobacco. She reports that she does not drink alcohol and does not use drugs.  Allergies  Allergen Reactions  . Amoxicillin     Really sick to stomach  . Onion Other (See Comments)    "Feels like throat is burning"  . Tylenol [Acetaminophen]     Stage 3 liver disease  . Doxycycline Itching    Family History  Problem Relation Age of Onset  . Dermatomyositis Mother   . Lupus Sister   . Colon cancer Paternal Grandmother   . Autism Brother   . Epilepsy Brother     Prior to Admission medications   Medication Sig Start Date End Date Taking? Authorizing Provider  albuterol (PROVENTIL HFA) 108 (90 Base) MCG/ACT inhaler Inhale 2 puffs into the lungs every 6 (six) hours as needed for wheezing or shortness of breath.   Yes [provider]  ALPRAZolam Duanne Moron) 1 MG tablet Take 1 mg by mouth 4 (four) times daily as needed for anxiety or sleep.  02/21/16  Yes [provider]  hydroxychloroquine (PLAQUENIL) 200 MG tablet Take 300 mg by mouth daily.  03/02/17  Yes [provider]  Ibuprofen (ADVIL MIGRAINE PO) Take 200 mg by mouth 2 (two)  times daily as needed (pain).    Yes [provider]  clobetasol (TEMOVATE) 0.05 % external solution Apply 1 application topically daily. Patient not taking: Reported on 06/30/2019 03/30/15   [provider]  dicyclomine (BENTYL) 20 MG tablet Take 1 tablet (20 mg total) by mouth every 6 (six) hours as needed for spasms (abdominal cramping). Patient not taking: Reported on 06/30/2019 05/08/17   Samuel Jester, DO  metoprolol succinate (TOPROL XL) 25 MG 24 hr tablet Take 0.5 tablets (12.5 mg total) by mouth daily. Patient not taking: Reported on 06/30/2019 12/06/16    Jodelle Gross, NP  nortriptyline (PAMELOR) 10 MG capsule Take by mouth. Patient not taking: Reported on 06/30/2019 05/12/17   [provider]  omeprazole (PRILOSEC) 40 MG capsule Take 40 mg by mouth daily as needed. Patient not taking: Reported on 06/30/2019    [provider]    Physical Exam: Vitals:   06/30/19 0200 06/30/19 0230 06/30/19 0530 06/30/19 0824  BP: 139/75  128/67 124/72  Pulse: (!) 52 68 72 (!) 56  Resp:   18 19  Temp:    100.1 F (37.8 C)  TempSrc:    Oral  SpO2: 98% 98% 98% 97%  Weight:    71.3 kg  Height:    5\' 4"  (1.626 m)    Constitutional: NAD, calm, comfortable Eyes: PERRL, lids and conjunctivae normal ENMT: Mucous membranes are moist. Posterior pharynx clear of any exudate or lesions.Normal dentition.  Neck: normal, supple, no masses, no thyromegaly Respiratory: clear to auscultation bilaterally, no wheezing, no crackles. Normal respiratory effort. No accessory muscle use.  Cardiovascular: Regular rate and rhythm, no murmurs / rubs / gallops. No extremity edema. 2+ pedal pulses. No carotid bruits.  Abdomen: Diffusely tender, more so in epigastrium and right upper quadrant, no masses palpated. No hepatosplenomegaly. Bowel sounds positive.  Musculoskeletal: no clubbing / cyanosis. No joint deformity upper and lower extremities. Good ROM, no contractures. Normal muscle tone.  Skin: Malar rash is present Neurologic: CN 2-12 grossly intact. Sensation intact, DTR normal. Strength 5/5 in all 4.  Psychiatric: Normal judgment and insight. Alert and oriented x 3. Normal mood.    Labs on Admission: I have personally reviewed following labs and imaging studies  CBC: Recent Labs  Lab 06/29/19 2135  WBC 15.5*  HGB 15.3*  HCT 45.7  MCV 80.7  PLT 268   Basic Metabolic Panel: Recent Labs  Lab 06/29/19 2135  NA 133*  K 4.5  CL 99  CO2 25  GLUCOSE 105*  BUN 6  CREATININE 0.56  CALCIUM 9.2   GFR: Estimated Creatinine Clearance:  103.1 mL/min (by C-G formula based on SCr of 0.56 mg/dL). Liver Function Tests: Recent Labs  Lab 06/29/19 2135  AST 31  ALT 41  ALKPHOS 99  BILITOT 0.4  PROT 7.6  ALBUMIN 4.1   Recent Labs  Lab 06/29/19 2135  LIPASE 17   No results for input(s): AMMONIA in the last 168 hours. Coagulation Profile: No results for input(s): INR, PROTIME in the last 168 hours. Cardiac Enzymes: No results for input(s): CKTOTAL, CKMB, CKMBINDEX, TROPONINI in the last 168 hours. BNP (last 3 results) No results for input(s): PROBNP in the last 8760 hours. HbA1C: No results for input(s): HGBA1C in the last 72 hours. CBG: No results for input(s): GLUCAP in the last 168 hours. Lipid Profile: No results for input(s): CHOL, HDL, LDLCALC, TRIG, CHOLHDL, LDLDIRECT in the last 72 hours. Thyroid Function Tests: No results for input(s): TSH,  T4TOTAL, FREET4, T3FREE, THYROIDAB in the last 72 hours. Anemia Panel: No results for input(s): VITAMINB12, FOLATE, FERRITIN, TIBC, IRON, RETICCTPCT in the last 72 hours. Urine analysis:    Component Value Date/Time   COLORURINE YELLOW 06/29/2019 2046   APPEARANCEUR HAZY (A) 06/29/2019 2046   LABSPEC 1.018 06/29/2019 2046   PHURINE 8.0 06/29/2019 2046   GLUCOSEU NEGATIVE 06/29/2019 2046   HGBUR SMALL (A) 06/29/2019 2046   BILIRUBINUR NEGATIVE 06/29/2019 2046   KETONESUR NEGATIVE 06/29/2019 2046   PROTEINUR 30 (A) 06/29/2019 2046   UROBILINOGEN 0.2 06/10/2010 1427   NITRITE NEGATIVE 06/29/2019 2046   LEUKOCYTESUR TRACE (A) 06/29/2019 2046    Radiological Exams on Admission: CT Abdomen Pelvis Wo Contrast  Result Date: 06/30/2019 CLINICAL DATA:  Abdominal distension EXAM: CT ABDOMEN AND PELVIS WITHOUT CONTRAST TECHNIQUE: Multidetector CT imaging of the abdomen and pelvis was performed following the standard protocol without IV contrast. COMPARISON:  None. FINDINGS: LOWER CHEST: Normal. HEPATOBILIARY: Normal hepatic contours. No intra- or extrahepatic biliary  dilatation. There is cholelithiasis with diffuse gallbladder wall thickening. PANCREAS: Normal pancreas. No ductal dilatation or peripancreatic fluid collection. SPLEEN: Normal. ADRENALS/URINARY TRACT: The adrenal glands are normal. No hydronephrosis, nephroureterolithiasis or solid renal mass. The urinary bladder is normal for degree of distention STOMACH/BOWEL: There is no hiatal hernia. Normal duodenal course and caliber. No small bowel dilatation or inflammation. No focal colonic abnormality. The appendix is not visualized. No right lower quadrant inflammation or free fluid. VASCULAR/LYMPHATIC: Normal course and caliber of the major abdominal vessels. No abdominal or pelvic lymphadenopathy. REPRODUCTIVE: Normal uterus. No adnexal mass. MUSCULOSKELETAL. No bony spinal canal stenosis or focal osseous abnormality. OTHER: None. IMPRESSION: Cholelithiasis with diffuse gallbladder wall thickening, concerning for acute cholecystitis. Electronically Signed   By: Deatra Robinson M.D.   On: 06/30/2019 05:00   US ABDOMEN LIMITED RUQ  Result Date: 06/30/2019 CLINICAL DATA:  Possible cholecystitis on CT scan.  Pain. EXAM: ULTRASOUND ABDOMEN LIMITED RIGHT UPPER QUADRANT COMPARISON:  CT scan June 30, 2019 FINDINGS: Gallbladder: There are stones and sludge in the gallbladder with wall thickening measuring 7 mm and a positive Murphy's sign. Common bile duct: Diameter: 4 mm Liver: No focal lesion identified. Within normal limits in parenchymal echogenicity. Portal vein is patent on color Doppler imaging with normal direction of blood flow towards the liver. Other: None. IMPRESSION: 1. Cholelithiasis and sludge in the gallbladder with wall thickening and a positive Murphy's sign. The constellation of findings is consistent with acute cholecystitis in the appropriate clinical setting. If the clinical picture remains ambiguous, HIDA scan could further evaluate. These results will be called to the ordering clinician or  representative by the Radiologist Assistant, and communication documented in the PACS or [employer]. Electronically Signed   By: Gerome Sam III M.D   On: 06/30/2019 09:15    Assessment/Plan Active Problems:   Chronic hepatitis C without hepatic coma (HCC)   Cholecystitis   Substance abuse (HCC)   SLE (systemic lupus erythematosus related syndrome) (HCC)     1. Acute cholecystitis.  Patient is started on ciprofloxacin and Flagyl.  We will continue antibiotics.  Continue supportive measures with pain management, IV fluids and antiemetics.  Start the patient on clear liquids.  General surgery consult.  Case reviewed with Dr. Lovell Sheehan and due to positive urine toxicology for cocaine, any operative management will likely need to be postponed until toxin is negative.  Continue supportive measures. 2. SLE.  Continue on Plaquenil.  She is [facility] at [facility]ology.  3. Substance abuse.  Patient's urine toxicology screen was positive for multiple agents. 4. Chronic hepatitis C.  Follows with gastroenterology.  LFTs are currently normal.  DVT prophylaxis: Lovenox Code Status: Full code Family Communication: Discussed with patient Disposition Plan: Discharge home once abdominal pain has improved Consults called: General surgery Admission status: Inpatient, MedSurg  Erick Blinks MD [facility]   If 7PM-7AM, please contact night-coverage [email or web address]   06/30/2019, 11:15 AM

## 2019-06-30 NOTE — Progress Notes (Signed)
Pharmacy Antibiotic Note  Linda Spence is a 24 y.o. female admitted on 06/29/2019 with intra-abdominal infection.  Pharmacy has been consulted for Cipro dosing.  Plan: Cipro 400mg  IV q12h Flagyl 500mg  IV q8h F/u cxs and clinical progress Monitor V/S, labs  Height: 5\' 4"  (162.6 cm) Weight: 71.3 kg (157 lb 3 oz) IBW/kg (Calculated) : 54.7  Temp (24hrs), Avg:99.2 F (37.3 C), Min:98.2 F (36.8 C), Max:100.1 F (37.8 C)  Recent Labs  Lab 06/29/19 2135  WBC 15.5*  CREATININE 0.56    Estimated Creatinine Clearance: 103.1 mL/min (by C-G formula based on SCr of 0.56 mg/dL).    Allergies  Allergen Reactions  . Amoxicillin     Really sick to stomach  . Onion Other (See Comments)    "Feels like throat is burning"  . Tylenol [Acetaminophen]     Stage 3 liver disease  . Doxycycline Itching    Antimicrobials this admission: cipro 6/27 >>  Flagyl 6/27 >>   Microbiology results: 6/27 SARS-2 CV is negative  Thank you for allowing pharmacy to be a part of this patient's care.  7/27, BS Pharm D, 7/27 Clinical Pharmacist Pager [phone] 06/30/2019 11:26 AM

## 2019-06-30 NOTE — ED Provider Notes (Signed)
[facility] EMERGENCY DEPARTMENT Provider Note   CSN: [id number] Arrival date & time: 06/29/19  2023   Time seen 12:50 AM  History Chief Complaint  Patient presents with  . Abdominal Pain    Linda Spence is a 24 y.o. female.  HPI     HPI Patient states a few days ago she started having "unbearable" pain.  She states it is diffuse in her abdomen and radiates into her back.  She describes as a knotting sensation.  She states "I feel like the [facility] has grabbed me and is squeezing me".  She has had nausea with vomiting just tonight in the ED twice.  She denies diarrhea.  She states she has had constipation.  Her last normal bowel movement was 3 to 4 days ago and they were hard like balls.  She states she has this a lot with her irritable bowel.  She denies fever.  She states any kind of movement that is too much or her clothes feel tight will make the pain worse.  She states her abdomen feels bloated.  She states she had laparoscopic surgery to remove a septum in her uterus.  That is the only prior surgery.  She states her last period finished a few days ago.  She states she is never had this pain before.  Patient has lupus and is followed by rheumatologist at [facility].  She also has a gastroenterologist at [facility].  She states she feels like her lupus is flared up right now, she is noted to have a butterfly rash and she feels like her joints have been hurting.   PCP Elfredia Nevins, MD  Past Medical History:  Diagnosis Date  . Asthma as a child  . Autoimmune diabetes (HCC)   . Bicornate uterus   . Chronic pelvic pain in female   . Depression   . Hepatitis C   . IBS (irritable bowel syndrome)   . Lupus [facility])     Patient Active Problem List   Diagnosis Date Noted  . Cholecystitis 06/30/2019  . Missed period 07/12/2017  . Nausea 07/12/2017  . Pregnancy examination or test, negative result 07/12/2017  . Acute encephalopathy 10/01/2015  . Abdominal pain, left lateral  10/01/2015  . History of UTI 10/01/2015  . Chronic hepatitis C without hepatic coma (HCC)   . Asthma childhood   . Chronic pelvic pain in female 11/19/2014  . Dyspareunia, female 11/19/2014  . 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 Double cervix     Past Surgical History:  Procedure Laterality Date  . PERINEAL LACERATION REPAIR  09/23/2011   Procedure: SUTURE REPAIR PERINEAL LACERATION;  Surgeon: Tilda Burrow, MD;  Location: [facility];  Service: Gynecology;  Laterality: N/A;  Repair Obstetric Laceration  . TONSILLECTOMY    . UTERINE SEPTUM RESECTION  08/2010   [facility]  2 cervices, nl ut ext contours  . VAGINAL SEPTUM RESECTION  06/15/2010   [name], normal uterine contour external     OB History    Gravida  3   Para  1   Term  1   Preterm      AB  1   Living  1     SAB  1   TAB      Ectopic      Multiple      Live Births  1        Obstetric Comments  Septate uterus, with 2 cervices, normal external  uterine contours, s/p resection of uterine longitudinal septum 08/2010, and s/p resection of longitudinal vaginal septum 06/15/2010 Cervix length of RIGHT cervix 3.3 cm on 05/09/2011        Family History  Problem Relation Age of Onset  . Dermatomyositis Mother   . Lupus Sister   . Colon cancer Paternal Grandmother   . Autism Brother   . Epilepsy Brother     Social History   Tobacco Use  . Smoking status: Current Every Day Smoker    Packs/day: 0.25    Years: 10.00    Pack years: 2.50    Types: Cigarettes    Start date: 09/25/2003  . Smokeless tobacco: Never Used  Vaping Use  . Vaping Use: Never used  Substance Use Topics  . Alcohol use: No    Comment: occ.  . Drug use: No    Types: Marijuana    Home Medications Prior to Admission medications   Medication Sig Start Date End Date Taking? Authorizing Provider  albuterol (PROVENTIL HFA) 108 (90 Base) MCG/ACT inhaler Inhale 2 puffs into the lungs every 6 (six) hours as needed for  wheezing or shortness of breath.    [provider]  ALPRAZolam Prudy Feeler) 1 MG tablet TAKE ONE TABLET BY MOUTH FOUR TIMES DAILY AS NEEDED 02/21/16   [provider]  clobetasol (TEMOVATE) 0.05 % external solution Apply 1 application topically daily. 03/30/15   [provider]  dicyclomine (BENTYL) 20 MG tablet Take 1 tablet (20 mg total) by mouth every 6 (six) hours as needed for spasms (abdominal cramping). 05/08/17   Samuel Jester, DO  hydroxychloroquine (PLAQUENIL) 200 MG tablet Take by mouth. 03/02/17   [provider]  Ibuprofen (ADVIL MIGRAINE PO) Take by mouth 2 (two) times daily as needed.    [provider]  metoprolol succinate (TOPROL XL) 25 MG 24 hr tablet Take 0.5 tablets (12.5 mg total) by mouth daily. 12/06/16   Jodelle Gross, NP  nortriptyline (PAMELOR) 10 MG capsule Take by mouth. 05/12/17   [provider]  omeprazole (PRILOSEC) 40 MG capsule Take 40 mg by mouth daily as needed.    [provider]    Allergies    Amoxicillin, Onion, and Tylenol [acetaminophen]  Review of Systems   Review of Systems  All other systems reviewed and are negative.   Physical Exam Updated Vital Signs BP 128/67 (BP Location: Right Arm)   Pulse 72   Temp 98.2 F (36.8 C) (Oral)   Resp 18   Ht 5\' 4"  (1.626 m)   Wt 61.2 kg   LMP 06/26/2019 (Exact Date)   SpO2 98%   BMI 23.17 kg/m   Physical Exam Vitals and nursing note reviewed.  Constitutional:      General: She is in acute distress.     Appearance: Normal appearance. She is normal weight.  HENT:     Head: Normocephalic and atraumatic.     Right Ear: External ear normal.     Left Ear: External ear normal.     Nose: Nose normal.     Mouth/Throat:     Mouth: Mucous membranes are dry.     Comments: Poor dentition Eyes:     Extraocular Movements: Extraocular movements intact.     Conjunctiva/sclera: Conjunctivae normal.     Pupils: Pupils are equal, round, and  reactive to light.  Cardiovascular:     Rate and Rhythm: Normal rate and regular rhythm.     Pulses: Normal pulses.  Pulmonary:  Effort: Pulmonary effort is normal. No respiratory distress.     Breath sounds: Normal breath sounds.  Abdominal:     General: Bowel sounds are normal. There is distension.     Tenderness: There is abdominal tenderness. There is no guarding or rebound.     Comments: Although she is tender diffusely she states it is more on the right side.  Musculoskeletal:        General: Normal range of motion.  Skin:    General: Skin is warm and dry.     Findings: Rash present.     Comments: Patient noted to have a butterfly rash on her face  Neurological:     General: No focal deficit present.     Mental Status: She is alert and oriented to person, place, and time.     Cranial Nerves: No cranial nerve deficit.  Psychiatric:        Mood and Affect: Mood normal.        Behavior: Behavior normal.        Thought Content: Thought content normal.     ED Results / Procedures / Treatments   Labs (all labs ordered are listed, but only abnormal results are displayed) Results for orders placed or performed during the hospital encounter of 06/29/19  CBC  Result Value Ref Range   WBC 15.5 (H) 4.0 - 10.5 K/uL   RBC 5.66 (H) 3.87 - 5.11 MIL/uL   Hemoglobin 15.3 (H) 12.0 - 15.0 g/dL   HCT 45.7 36 - 46 %   MCV 80.7 80.0 - 100.0 fL   MCH 27.0 26.0 - 34.0 pg   MCHC 33.5 30.0 - 36.0 g/dL   RDW 12.8 11.5 - 15.5 %   Platelets 268 150 - 400 K/uL   nRBC 0.0 0.0 - 0.2 %  Basic metabolic panel  Result Value Ref Range   Sodium 133 (L) 135 - 145 mmol/L   Potassium 4.5 3.5 - 5.1 mmol/L   Chloride 99 98 - 111 mmol/L   CO2 25 22 - 32 mmol/L   Glucose, Bld 105 (H) 70 - 99 mg/dL   BUN 6 6 - 20 mg/dL   Creatinine, Ser 0.56 0.44 - 1.00 mg/dL   Calcium 9.2 8.9 - 10.3 mg/dL   GFR calc non Af Amer >60 >60 mL/min   GFR calc Af Amer >60 >60 mL/min   Anion gap 9 5 - 15  Lipase, blood    Result Value Ref Range   Lipase 17 11 - 51 U/L  Hepatic function panel  Result Value Ref Range   Total Protein 7.6 6.5 - 8.1 g/dL   Albumin 4.1 3.5 - 5.0 g/dL   AST 31 15 - 41 U/L   ALT 41 0 - 44 U/L   Alkaline Phosphatase 99 38 - 126 U/L   Total Bilirubin 0.4 0.3 - 1.2 mg/dL   Bilirubin, Direct 0.1 0.0 - 0.2 mg/dL   Indirect Bilirubin 0.3 0.3 - 0.9 mg/dL  Pregnancy, urine  Result Value Ref Range   Preg Test, Ur NEGATIVE NEGATIVE  Urine rapid drug screen (hosp performed)  Result Value Ref Range   Opiates POSITIVE (A) NONE DETECTED   Cocaine POSITIVE (A) NONE DETECTED   Benzodiazepines POSITIVE (A) NONE DETECTED   Amphetamines POSITIVE (A) NONE DETECTED   Tetrahydrocannabinol POSITIVE (A) NONE DETECTED   Barbiturates NONE DETECTED NONE DETECTED  hCG, quantitative, pregnancy  Result Value Ref Range   hCG, Beta Chain, Quant, S 1 <5 mIU/mL  Laboratory interpretation all normal except leukocytosis, very abnormal UDS    EKG None  Radiology CT Abdomen Pelvis Wo Contrast  Result Date: 06/30/2019 CLINICAL DATA:  Abdominal distension EXAM: CT ABDOMEN AND PELVIS WITHOUT CONTRAST TECHNIQUE: Multidetector CT imaging of the abdomen and pelvis was performed following the standard protocol without IV contrast. COMPARISON:  None. FINDINGS: LOWER CHEST: Normal. HEPATOBILIARY: Normal hepatic contours. No intra- or extrahepatic biliary dilatation. There is cholelithiasis with diffuse gallbladder wall thickening. PANCREAS: Normal pancreas. No ductal dilatation or peripancreatic fluid collection. SPLEEN: Normal. ADRENALS/URINARY TRACT: The adrenal glands are normal. No hydronephrosis, nephroureterolithiasis or solid renal mass. The urinary bladder is normal for degree of distention STOMACH/BOWEL: There is no hiatal hernia. Normal duodenal course and caliber. No small bowel dilatation or inflammation. No focal colonic abnormality. The appendix is not visualized. No right lower quadrant inflammation  or free fluid. VASCULAR/LYMPHATIC: Normal course and caliber of the major abdominal vessels. No abdominal or pelvic lymphadenopathy. REPRODUCTIVE: Normal uterus. No adnexal mass. MUSCULOSKELETAL. No bony spinal canal stenosis or focal osseous abnormality. OTHER: None. IMPRESSION: Cholelithiasis with diffuse gallbladder wall thickening, concerning for acute cholecystitis. Electronically Signed   By: Deatra Robinson M.D.   On: 06/30/2019 05:00    Procedures Procedures (including critical care time)  Medications Ordered in ED Medications  iohexol (OMNIPAQUE) 9 MG/ML oral solution (has no administration in time range)  ciprofloxacin (CIPRO) IVPB 400 mg (400 mg Intravenous New Bag/Given 06/30/19 0543)    And  metroNIDAZOLE (FLAGYL) IVPB 500 mg (500 mg Intravenous New Bag/Given 06/30/19 0543)  ondansetron (ZOFRAN) tablet 4 mg (4 mg Oral Given 06/29/19 2107)  sodium chloride 0.9 % bolus 1,000 mL (0 mLs Intravenous Stopped 06/30/19 0322)  sodium chloride 0.9 % bolus 500 mL (0 mLs Intravenous Stopped 06/30/19 0322)  fentaNYL (SUBLIMAZE) injection 50 mcg (50 mcg Intravenous Given 06/30/19 0140)  ondansetron (ZOFRAN) injection 4 mg (4 mg Intravenous Given 06/30/19 0139)  sodium chloride 0.9 % bolus 1,000 mL (1,000 mLs Intravenous New Bag/Given 06/30/19 0544)  fentaNYL (SUBLIMAZE) injection 50 mcg (50 mcg Intravenous Given 06/30/19 0544)  ondansetron (ZOFRAN) injection 4 mg (4 mg Intravenous Given 06/30/19 0545)    ED Course  I have reviewed the triage vital signs and the nursing notes.  Pertinent labs & imaging results that were available during my care of the patient were reviewed by me and considered in my medical decision making (see chart for details).    MDM Rules/Calculators/A&P                           Patient's labs were not too abnormal except for leukocytosis.  CT scan was done due to the degree of pain she had.  After reviewing her CT scan she was started on Cipro and Flagyl due to penicillin  allergy.  I talked to the patient about her CT results and need for admission and she is agreeable.  5:47 AM Dr. Sharl Ma, hospitalist will admit.  Final Clinical Impression(s) / ED Diagnoses Final diagnoses:  Cholecystitis  Polysubstance abuse (HCC)  Cocaine abuse (HCC)  Methamphetamine abuse (HCC)  Benzodiazepine abuse (HCC)    Rx / DC Orders  Plan admission  Devoria Albe, MD, Concha Pyo, MD 06/30/19 [phone]

## 2019-06-30 NOTE — Progress Notes (Signed)
Received call about ABD CT and said possible need for HIDA scan.  Sent message on to Dr. Kerry Hough by secure chat

## 2019-06-30 NOTE — Progress Notes (Signed)
Has vomited green emesis since being admitted to room  Received zofran and dilaudid afterwards.  Abd pain rated an 8.

## 2019-06-30 NOTE — Progress Notes (Signed)
Has not vomited since the one time this morning.  Has received zofran and dilaudid twice since admission to floor. Pain rated about a 6 before dilaudid

## 2019-07-01 DIAGNOSIS — K8000 Calculus of gallbladder with acute cholecystitis without obstruction: Principal | ICD-10-CM

## 2019-07-01 LAB — HIV ANTIBODY (ROUTINE TESTING W REFLEX): HIV Screen 4th Generation wRfx: NONREACTIVE

## 2019-07-01 LAB — COMPREHENSIVE METABOLIC PANEL
ALT: 40 U/L (ref 0–44)
AST: 25 U/L (ref 15–41)
Albumin: 3 g/dL — ABNORMAL LOW (ref 3.5–5.0)
Alkaline Phosphatase: 83 U/L (ref 38–126)
Anion gap: 10 (ref 5–15)
BUN: 7 mg/dL (ref 6–20)
CO2: 25 mmol/L (ref 22–32)
Calcium: 8.1 mg/dL — ABNORMAL LOW (ref 8.9–10.3)
Chloride: 102 mmol/L (ref 98–111)
Creatinine, Ser: 0.59 mg/dL (ref 0.44–1.00)
GFR calc Af Amer: >60 mL/min (ref >60)
GFR calc non Af Amer: >60 mL/min (ref >60)
Glucose, Bld: 96 mg/dL (ref 70–99)
Potassium: 3.3 mmol/L — ABNORMAL LOW (ref 3.5–5.1)
Sodium: 137 mmol/L (ref 135–145)
Total Bilirubin: 0.6 mg/dL (ref 0.3–1.2)
Total Protein: 5.9 g/dL — ABNORMAL LOW (ref 6.5–8.1)

## 2019-07-01 LAB — CBC
HCT: 37.4 % (ref 36.0–46.0)
Hemoglobin: 12.6 g/dL (ref 12.0–15.0)
MCH: 27.5 pg (ref 26.0–34.0)
MCHC: 33.7 g/dL (ref 30.0–36.0)
MCV: 81.7 fL (ref 80.0–100.0)
Platelets: 224 10*3/uL (ref 150–400)
RBC: 4.58 MIL/uL (ref 3.87–5.11)
RDW: 13 % (ref 11.5–15.5)
WBC: 13.3 10*3/uL — ABNORMAL HIGH (ref 4.0–10.5)
nRBC: 0 % (ref 0.0–0.2)

## 2019-07-01 MED ORDER — HYDROMORPHONE HCL 1 MG/ML IJ SOLN
1.0000 mg | Freq: Three times a day (TID) | INTRAMUSCULAR | Status: DC | PRN
  Administered 2019-07-02 (×2): 1 mg via INTRAVENOUS
  Filled 2019-07-01 (×2): qty 1

## 2019-07-01 MED ORDER — POTASSIUM CHLORIDE CRYS ER 20 MEQ PO TBCR
40.0000 meq | EXTENDED_RELEASE_TABLET | Freq: Once | ORAL | Status: AC
  Administered 2019-07-01: 40 meq via ORAL
  Filled 2019-07-01: qty 2

## 2019-07-01 NOTE — Consult Note (Signed)
Reason for Consult: Right upper quadrant abdominal pain, nausea, and vomiting Referring Physician: Dr. Manya Silvas is an 24 y.o. female.  HPI: Patient is a 24 year old [place] female with a history of hepatitis C and illicit drug use who presented to the emergency room with several day history of worsening right upper quadrant abdominal pain, nausea, and vomiting. She was evaluated in the emergency room yesterday was found to have an elevated [place] blood cell count of 15,000 with normal LFTs. Her CT scan of the abdomen showed cholelithiasis with diffuse gallbladder wall thickening, consistent with acute cholecystitis. She is admitted to the hospital for IV hydration, control of her pain and nausea, and IV antibiotics. Ultrasound shows cholelithiasis with a thickened gallbladder wall and a positive Murphy sign. This morning, she states she is feeling better, though she did not sleep well. She recounts that she may have had similar episodes over the past few years. She denies any fatty food intolerance. She denies any fever, chills, or jaundice.  Past Medical History:  Diagnosis Date  . Asthma as a child  . Autoimmune diabetes (HCC)   . Bicornate uterus   . Chronic pelvic pain in female   . Depression   . Hepatitis C   . IBS (irritable bowel syndrome)   . Lupus [facility])     Past Surgical History:  Procedure Laterality Date  . PERINEAL LACERATION REPAIR  09/23/2011   Procedure: SUTURE REPAIR PERINEAL LACERATION;  Surgeon: Tilda Burrow, MD;  Location: [facility];  Service: Gynecology;  Laterality: N/A;  Repair Obstetric Laceration  . TONSILLECTOMY    . UTERINE SEPTUM RESECTION  08/2010   [facility]  2 cervices, nl ut ext contours  . VAGINAL SEPTUM RESECTION  06/15/2010   [name], normal uterine contour external    Family History  Problem Relation Age of Onset  . Dermatomyositis Mother   . Lupus Sister   . Colon cancer Paternal Grandmother   . Autism Brother    . Epilepsy Brother     Social History:  reports that she has been smoking cigarettes. She started smoking about 15 years ago. She has a 2.50 pack-year smoking history. She has never used smokeless tobacco. She reports that she does not drink alcohol and does not use drugs.  Allergies:  Allergies  Allergen Reactions  . Amoxicillin     Really sick to stomach  . Onion Other (See Comments)    "Feels like throat is burning"  . Tylenol [Acetaminophen]     Stage 3 liver disease  . Doxycycline Itching    Medications: I have reviewed the patient's current medications.  Results for orders placed or performed during the hospital encounter of 06/29/19 (from the past 48 hour(s))  Urinalysis, Routine w reflex microscopic     Status: Abnormal   Collection Time: 06/29/19  8:46 PM  Result Value Ref Range   Color, Urine YELLOW YELLOW   APPearance HAZY (A) CLEAR   Specific Gravity, Urine 1.018 1.005 - 1.030   pH 8.0 5.0 - 8.0   Glucose, UA NEGATIVE NEGATIVE mg/dL   Hgb urine dipstick SMALL (A) NEGATIVE   Bilirubin Urine NEGATIVE NEGATIVE   Ketones, ur NEGATIVE NEGATIVE mg/dL   Protein, ur 30 (A) NEGATIVE mg/dL   Nitrite NEGATIVE NEGATIVE   Leukocytes,Ua TRACE (A) NEGATIVE   RBC / HPF 21-50 0 - 5 RBC/hpf   WBC, UA 6-10 0 - 5 WBC/hpf   Bacteria, UA FEW (A) NONE  SEEN   Squamous Epithelial / LPF 6-10 0 - 5   Mucus PRESENT    Hyaline Casts, UA PRESENT     Comment: Performed at [facility], [address] [place], [place] [address]  CBC     Status: Abnormal   Collection Time: 06/29/19  9:35 PM  Result Value Ref Range   WBC 15.5 (H) 4.0 - 10.5 K/uL   RBC 5.66 (H) 3.87 - 5.11 MIL/uL   Hemoglobin 15.3 (H) 12.0 - 15.0 g/dL   HCT 45.7 36 - 46 %   MCV 80.7 80.0 - 100.0 fL   MCH 27.0 26.0 - 34.0 pg   MCHC 33.5 30.0 - 36.0 g/dL   RDW 12.8 11.5 - 15.5 %   Platelets 268 150 - 400 K/uL   nRBC 0.0 0.0 - 0.2 %    Comment: Performed at [facility], [address] [place], [place] [address]  Basic  metabolic panel     Status: Abnormal   Collection Time: 06/29/19  9:35 PM  Result Value Ref Range   Sodium 133 (L) 135 - 145 mmol/L   Potassium 4.5 3.5 - 5.1 mmol/L   Chloride 99 98 - 111 mmol/L   CO2 25 22 - 32 mmol/L   Glucose, Bld 105 (H) 70 - 99 mg/dL    Comment: Glucose reference range applies only to samples taken after fasting for at least 8 hours.   BUN 6 6 - 20 mg/dL   Creatinine, Ser 0.56 0.44 - 1.00 mg/dL   Calcium 9.2 8.9 - 10.3 mg/dL   GFR calc non Af Amer >60 >60 mL/min   GFR calc Af Amer >60 >60 mL/min   Anion gap 9 5 - 15    Comment: Performed at [facility], [address], [place], [place] [address]  Lipase, blood     Status: None   Collection Time: 06/29/19  9:35 PM  Result Value Ref Range   Lipase 17 11 - 51 U/L    Comment: Performed at [facility], [address], [place], [place] [address]  Hepatic function panel     Status: None   Collection Time: 06/29/19  9:35 PM  Result Value Ref Range   Total Protein 7.6 6.5 - 8.1 g/dL   Albumin 4.1 3.5 - 5.0 g/dL   AST 31 15 - 41 U/L   ALT 41 0 - 44 U/L   Alkaline Phosphatase 99 38 - 126 U/L   Total Bilirubin 0.4 0.3 - 1.2 mg/dL   Bilirubin, Direct 0.1 0.0 - 0.2 mg/dL   Indirect Bilirubin 0.3 0.3 - 0.9 mg/dL    Comment: Performed at [facility], [address], [place], [place] [address]  hCG, quantitative, pregnancy     Status: None   Collection Time: 06/29/19  9:35 PM  Result Value Ref Range   hCG, Beta Chain, Quant, S 1 <5 mIU/mL    Comment:          GEST. AGE      CONC.  (mIU/mL)   <=1 WEEK        5 - 50     2 WEEKS       50 - 500     3 WEEKS       100 - 10,000     4 WEEKS     1,000 - 30,000     5 WEEKS     3,500 - 115,000   6-8 WEEKS     12,000 - 270,000  12 WEEKS     15,000 - 220,000        FEMALE AND NON-PREGNANT FEMALE:     LESS THAN 5 mIU/mL Performed at [facility], [address] [place], [place] [address]   HIV Antibody (routine testing w rflx)     Status: None   Collection Time: 06/29/19   9:35 PM  Result Value Ref Range   HIV Screen 4th Generation wRfx Non Reactive Non Reactive    Comment: Performed at [facility], [address], [place], [place] [address]  Pregnancy, urine     Status: None   Collection Time: 06/30/19  1:06 AM  Result Value Ref Range   Preg Test, Ur NEGATIVE NEGATIVE    Comment:        THE SENSITIVITY OF THIS METHODOLOGY IS >20 mIU/mL. Performed at [facility], [address] [place], [place] [address]   Urine rapid drug screen (hosp performed)     Status: Abnormal   Collection Time: 06/30/19  1:06 AM  Result Value Ref Range   Opiates POSITIVE (A) NONE DETECTED   Cocaine POSITIVE (A) NONE DETECTED   Benzodiazepines POSITIVE (A) NONE DETECTED   Amphetamines POSITIVE (A) NONE DETECTED   Tetrahydrocannabinol POSITIVE (A) NONE DETECTED   Barbiturates NONE DETECTED NONE DETECTED    Comment: (NOTE) DRUG SCREEN FOR MEDICAL PURPOSES ONLY.  IF CONFIRMATION IS NEEDED FOR ANY PURPOSE, NOTIFY LAB WITHIN 5 DAYS.  LOWEST DETECTABLE LIMITS FOR URINE DRUG SCREEN Drug Class                     Cutoff (ng/mL) Amphetamine and metabolites    1000 Barbiturate and metabolites    200 Benzodiazepine                 200 Tricyclics and metabolites     300 Opiates and metabolites        300 Cocaine and metabolites        300 THC                            50 Performed at [facility], [address], [place], [place] [address]   SARS Coronavirus 2 by RT PCR (hospital order, performed in [facility] hospital lab) Nasopharyngeal Nasopharyngeal Swab     Status: None   Collection Time: 06/30/19  6:25 AM   Specimen: Nasopharyngeal Swab  Result Value Ref Range   SARS Coronavirus 2 NEGATIVE NEGATIVE    Comment: (NOTE) SARS-CoV-2 target nucleic acids are NOT DETECTED.  The SARS-CoV-2 RNA is generally detectable in upper and lower respiratory specimens during the acute phase of infection. The lowest concentration of SARS-CoV-2 viral copies this assay can detect  is 250 copies / mL. A negative result does not preclude SARS-CoV-2 infection and should not be used as the sole basis for treatment or other patient management decisions.  A negative result may occur with improper specimen collection / handling, submission of specimen other than nasopharyngeal swab, presence of viral mutation(s) within the areas targeted by this assay, and inadequate number of viral copies (<250 copies / mL). A negative result must be combined with clinical observations, patient history, and epidemiological information.  Fact Sheet for Patients:   BoilerBrush.com.cy  Fact Sheet for Healthcare Providers: https://pope.com/  This test is not yet approved or  cleared by the [place] FDA and has been authorized for detection and/or diagnosis of SARS-CoV-2 by FDA under  an Emergency Use Authorization (EUA).  This EUA will remain in effect (meaning this test can be used) for the duration of the COVID-19 declaration under Section 564(b)(1) of the Act, 21 U.S.C. section 360bbb-3(b)(1), unless the authorization is terminated or revoked sooner.  Performed at [facility], [address] [place], [place] [address]   Comprehensive metabolic panel     Status: Abnormal   Collection Time: 07/01/19  4:10 AM  Result Value Ref Range   Sodium 137 135 - 145 mmol/L   Potassium 3.3 (L) 3.5 - 5.1 mmol/L    Comment: DELTA CHECK NOTED   Chloride 102 98 - 111 mmol/L   CO2 25 22 - 32 mmol/L   Glucose, Bld 96 70 - 99 mg/dL    Comment: Glucose reference range applies only to samples taken after fasting for at least 8 hours.   BUN 7 6 - 20 mg/dL   Creatinine, Ser 6.040.59 0.44 - 1.00 mg/dL   Calcium 8.1 (L) 8.9 - 10.3 mg/dL   Total Protein 5.9 (L) 6.5 - 8.1 g/dL   Albumin 3.0 (L) 3.5 - 5.0 g/dL   AST 25 15 - 41 U/L   ALT 40 0 - 44 U/L   Alkaline Phosphatase 83 38 - 126 U/L   Total Bilirubin 0.6 0.3 - 1.2 mg/dL   GFR calc non Af Amer >60  >60 mL/min   GFR calc Af Amer >60 >60 mL/min   Anion gap 10 5 - 15    Comment: Performed at [facility], [address] [place], [place] [address]  CBC     Status: Abnormal   Collection Time: 07/01/19  4:10 AM  Result Value Ref Range   WBC 13.3 (H) 4.0 - 10.5 K/uL   RBC 4.58 3.87 - 5.11 MIL/uL   Hemoglobin 12.6 12.0 - 15.0 g/dL   HCT 11.937.4 36 - 46 %   MCV 81.7 80.0 - 100.0 fL   MCH 27.5 26.0 - 34.0 pg   MCHC 33.7 30.0 - 36.0 g/dL   RDW 14.713.0 82.911.5 - 56.215.5 %   Platelets 224 150 - 400 K/uL   nRBC 0.0 0.0 - 0.2 %    Comment: Performed at [facility], [address] [place], [place] [address]    CT Abdomen Pelvis Wo Contrast  Result Date: 06/30/2019 CLINICAL DATA:  Abdominal distension EXAM: CT ABDOMEN AND PELVIS WITHOUT CONTRAST TECHNIQUE: Multidetector CT imaging of the abdomen and pelvis was performed following the standard protocol without IV contrast. COMPARISON:  None. FINDINGS: LOWER CHEST: Normal. HEPATOBILIARY: Normal hepatic contours. No intra- or extrahepatic biliary dilatation. There is cholelithiasis with diffuse gallbladder wall thickening. PANCREAS: Normal pancreas. No ductal dilatation or peripancreatic fluid collection. SPLEEN: Normal. ADRENALS/URINARY TRACT: The adrenal glands are normal. No hydronephrosis, nephroureterolithiasis or solid renal mass. The urinary bladder is normal for degree of distention STOMACH/BOWEL: There is no hiatal hernia. Normal duodenal course and caliber. No small bowel dilatation or inflammation. No focal colonic abnormality. The appendix is not visualized. No right lower quadrant inflammation or free fluid. VASCULAR/LYMPHATIC: Normal course and caliber of the major abdominal vessels. No abdominal or pelvic lymphadenopathy. REPRODUCTIVE: Normal uterus. No adnexal mass. MUSCULOSKELETAL. No bony spinal canal stenosis or focal osseous abnormality. OTHER: None. IMPRESSION: Cholelithiasis with diffuse gallbladder wall thickening, concerning for acute  cholecystitis. Electronically Signed   By: Deatra RobinsonKevin  Herman M.D.   On: 06/30/2019 05:00   US ABDOMEN LIMITED RUQ  Result Date: 06/30/2019 CLINICAL DATA:  Possible cholecystitis on CT scan.  Pain. EXAM: ULTRASOUND ABDOMEN LIMITED RIGHT UPPER QUADRANT COMPARISON:  CT scan June 30, 2019 FINDINGS: Gallbladder: There are stones and sludge in the gallbladder with wall thickening measuring 7 mm and a positive Murphy's sign. Common bile duct: Diameter: 4 mm Liver: No focal lesion identified. Within normal limits in parenchymal echogenicity. Portal vein is patent on color Doppler imaging with normal direction of blood flow towards the liver. Other: None. IMPRESSION: 1. Cholelithiasis and sludge in the gallbladder with wall thickening and a positive Murphy's sign. The constellation of findings is consistent with acute cholecystitis in the appropriate clinical setting. If the clinical picture remains ambiguous, HIDA scan could further evaluate. These results will be called to the ordering clinician or representative by the Radiologist Assistant, and communication documented in the PACS or [employer]. Electronically Signed   By: Gerome Sam III M.D   On: 06/30/2019 09:15    ROS:  Pertinent items are noted in HPI.  Blood pressure (!) 88/59, pulse 88, temperature 98.8 F (37.1 C), resp. rate 18, height 5\' 4"  (1.626 m), weight 71.3 kg, last menstrual period 06/26/2019, SpO2 100 %. Physical Exam: Pleasant [place] female no acute distress Head is normocephalic, atraumatic Eyes without scleral icterus Lungs are clear to auscultation with good breath sounds bilaterally Heart examination reveals regular rate and rhythm without S3, S4, murmurs Abdomen soft with tenderness in the right upper quadrant to palpation. No rigidity is noted.  H&P reviewed. X-ray results reviewed.  Assessment/Plan: Impression: Acute cholecystitis, cholelithiasis. Patient also testing positive for multiple illicit drugs including  cocaine. She last did cocaine approximately 3 days ago. Due to her recent cocaine history, we have to delay any surgical intervention until she tests negative. She understands this. We will treat this medically. I will follow her as an outpatient to perform elective laparoscopic cholecystectomy. Continue current management. May advance diet as tolerated.  06/28/2019 07/01/2019, 11:35 AM

## 2019-07-01 NOTE — Progress Notes (Signed)
PROGRESS NOTE    Linda Spence  WHQ:[id number] DOB: 1995/03/07 DOA: 06/29/2019 PCP: Redmond School, MD    Brief Narrative:  HPI: Linda Spence is a 24 y.o. female with medical history significant of lupus on Plaquenil, hepatitis C, is brought to the hospital with abdominal pain, nausea and vomiting.  She reports having the symptoms for the last few days.  She is unsure whether she has had any fevers.  She describes substernal chest pain that is burning type and worse with vomiting.  Vomitus is green in color.  She is not had any diarrhea.  Abdominal pain is worse with p.o. intake. She He was evaluated in the emergency room where LFTs were noted to be normal.  WBC count elevated at 15,000.  CT scan of the abdomen pelvis was performed that showed cholelithiasis and gallbladder wall thickening.  She subsequently had a abdominal ultrasound that showed positive Murphy sign, cholelithiasis, sludge and gallbladder wall thickening.  Urine toxicology screen is positive for benzodiazepines, opiates, cannabis, amphetamines, and cocaine.  She was referred for admission.   Assessment & Plan:   Active Problems:   Chronic hepatitis C without hepatic coma (HCC)   Cholecystitis   Substance abuse (HCC)   SLE (systemic lupus erythematosus related syndrome) (HCC)   Calculus of gallbladder with acute cholecystitis without obstruction   1. Acute cholecystitis.  Patient is on ciprofloxacin and Flagyl.  We will continue antibiotics.  Continue supportive measures with pain management, IV fluids and antiemetics.    She is tolerating clear liquids, will advance to full liquids.  General surgery consult.  Case reviewed with Dr. Arnoldo Morale and due to positive urine toxicology for cocaine, any operative management will likely need to be postponed until tox screen is negative.  Continue supportive measures. 2. SLE.  Continue on Plaquenil.  She is followed at [facility] rheumatology. 3. Substance abuse.  Patient's  urine toxicology screen was positive for multiple agents. 4. Chronic hepatitis C.  Follows with gastroenterology.  LFTs are currently normal.   DVT prophylaxis: enoxaparin (LOVENOX) injection 40 mg Start: 06/30/19 2200  Code Status: Full code Family Communication: Discussed with patient Disposition Plan: Status is: Inpatient  Remains inpatient appropriate because:IV treatments appropriate due to intensity of illness or inability to take PO   Dispo: The patient is from: Home              Anticipated d/c is to: Home              Anticipated d/c date is: 1 day              Patient currently is not medically stable to d/c.    Consultants:   General surgery  Procedures:     Antimicrobials:   Ciprofloxacin 6/27 >  Flagyl 6/27 >   Subjective: Continues to have abdominal pain although mildly better.  Nausea and vomiting is better.  Tolerating clear liquids.  Objective: Vitals:   06/30/19 2110 07/01/19 0535 07/01/19 1456 07/01/19 2005  BP: 114/77 (!) 88/59 116/72   Pulse: 83 88 68   Resp: 18 18 16    Temp: 99.2 F (37.3 C) 98.8 F (37.1 C) 98.9 F (37.2 C)   TempSrc: Oral  Oral   SpO2: 99% 100% 100% 100%  Weight:      Height:        Intake/Output Summary (Last 24 hours) at 07/01/2019 2024 Last data filed at 07/01/2019 1502 Gross per 24 hour  Intake 2035.39 ml  Output --  Net 2035.39 ml   Filed Weights   06/29/19 2042 06/30/19 0824  Weight: 61.2 kg 71.3 kg    Examination:  General exam: Appears calm and comfortable  Respiratory system: Clear to auscultation. Respiratory effort normal. Cardiovascular system: S1 & S2 heard, RRR. No JVD, murmurs, rubs, gallops or clicks. No pedal edema. Gastrointestinal system: Abdomen is nondistended, soft and still diffusely tender. No organomegaly or masses felt. Normal bowel sounds heard. Central nervous system: Alert and oriented. No focal neurological deficits. Extremities: Symmetric 5 x 5 power. Skin: No rashes,  lesions or ulcers Psychiatry: Judgement and insight appear normal. Mood & affect appropriate.     Data Reviewed: I have personally reviewed following labs and imaging studies  CBC: Recent Labs  Lab 06/29/19 2135 07/01/19 0410  WBC 15.5* 13.3*  HGB 15.3* 12.6  HCT 45.7 37.4  MCV 80.7 81.7  PLT 268 224   Basic Metabolic Panel: Recent Labs  Lab 06/29/19 2135 07/01/19 0410  NA 133* 137  K 4.5 3.3*  CL 99 102  CO2 25 25  GLUCOSE 105* 96  BUN 6 7  CREATININE 0.56 0.59  CALCIUM 9.2 8.1*   GFR: Estimated Creatinine Clearance: 103.1 mL/min (by C-G formula based on SCr of 0.59 mg/dL). Liver Function Tests: Recent Labs  Lab 06/29/19 2135 07/01/19 0410  AST 31 25  ALT 41 40  ALKPHOS 99 83  BILITOT 0.4 0.6  PROT 7.6 5.9*  ALBUMIN 4.1 3.0*   Recent Labs  Lab 06/29/19 2135  LIPASE 17   No results for input(s): AMMONIA in the last 168 hours. Coagulation Profile: No results for input(s): INR, PROTIME in the last 168 hours. Cardiac Enzymes: No results for input(s): CKTOTAL, CKMB, CKMBINDEX, TROPONINI in the last 168 hours. BNP (last 3 results) No results for input(s): PROBNP in the last 8760 hours. HbA1C: No results for input(s): HGBA1C in the last 72 hours. CBG: No results for input(s): GLUCAP in the last 168 hours. Lipid Profile: No results for input(s): CHOL, HDL, LDLCALC, TRIG, CHOLHDL, LDLDIRECT in the last 72 hours. Thyroid Function Tests: No results for input(s): TSH, T4TOTAL, FREET4, T3FREE, THYROIDAB in the last 72 hours. Anemia Panel: No results for input(s): VITAMINB12, FOLATE, FERRITIN, TIBC, IRON, RETICCTPCT in the last 72 hours. Sepsis Labs: No results for input(s): PROCALCITON, LATICACIDVEN in the last 168 hours.  Recent Results (from the past 240 hour(s))  SARS Coronavirus 2 by RT PCR (hospital order, performed in [facility] hospital lab) Nasopharyngeal Nasopharyngeal Swab     Status: None   Collection Time: 06/30/19  6:25 AM   Specimen:  Nasopharyngeal Swab  Result Value Ref Range Status   SARS Coronavirus 2 NEGATIVE NEGATIVE Final    Comment: (NOTE) SARS-CoV-2 target nucleic acids are NOT DETECTED.  The SARS-CoV-2 RNA is generally detectable in upper and lower respiratory specimens during the acute phase of infection. The lowest concentration of SARS-CoV-2 viral copies this assay can detect is 250 copies / mL. A negative result does not preclude SARS-CoV-2 infection and should not be used as the sole basis for treatment or other patient management decisions.  A negative result may occur with improper specimen collection / handling, submission of specimen other than nasopharyngeal swab, presence of viral mutation(s) within the areas targeted by this assay, and inadequate number of viral copies (<250 copies / mL). A negative result must be combined with clinical observations, patient history, and epidemiological information.  Fact Sheet for Patients:   BoilerBrush.com.cy  Fact Sheet for Healthcare Providers:  https://pope.com/  This test is not yet approved or  cleared by the [place] and has been authorized for detection and/or diagnosis of SARS-CoV-2 by FDA under an Emergency Use Authorization (EUA).  This EUA will remain in effect (meaning this test can be used) for the duration of the COVID-19 declaration under Section 564(b)(1) of the Act, 21 U.S.C. section 360bbb-3(b)(1), unless the authorization is terminated or revoked sooner.  Performed at [facility], [address], [place], [place] [address]          Radiology Studies: CT Abdomen Pelvis Wo Contrast  Result Date: 06/30/2019 CLINICAL DATA:  Abdominal distension EXAM: CT ABDOMEN AND PELVIS WITHOUT CONTRAST TECHNIQUE: Multidetector CT imaging of the abdomen and pelvis was performed following the standard protocol without IV contrast. COMPARISON:  None. FINDINGS: LOWER CHEST: Normal.  HEPATOBILIARY: Normal hepatic contours. No intra- or extrahepatic biliary dilatation. There is cholelithiasis with diffuse gallbladder wall thickening. PANCREAS: Normal pancreas. No ductal dilatation or peripancreatic fluid collection. SPLEEN: Normal. ADRENALS/URINARY TRACT: The adrenal glands are normal. No hydronephrosis, nephroureterolithiasis or solid renal mass. The urinary bladder is normal for degree of distention STOMACH/BOWEL: There is no hiatal hernia. Normal duodenal course and caliber. No small bowel dilatation or inflammation. No focal colonic abnormality. The appendix is not visualized. No right lower quadrant inflammation or free fluid. VASCULAR/LYMPHATIC: Normal course and caliber of the major abdominal vessels. No abdominal or pelvic lymphadenopathy. REPRODUCTIVE: Normal uterus. No adnexal mass. MUSCULOSKELETAL. No bony spinal canal stenosis or focal osseous abnormality. OTHER: None. IMPRESSION: Cholelithiasis with diffuse gallbladder wall thickening, concerning for acute cholecystitis. Electronically Signed   By: Deatra Robinson M.D.   On: 06/30/2019 05:00   US ABDOMEN LIMITED RUQ  Result Date: 06/30/2019 CLINICAL DATA:  Possible cholecystitis on CT scan.  Pain. EXAM: ULTRASOUND ABDOMEN LIMITED RIGHT UPPER QUADRANT COMPARISON:  CT scan June 30, 2019 FINDINGS: Gallbladder: There are stones and sludge in the gallbladder with wall thickening measuring 7 mm and a positive Murphy's sign. Common bile duct: Diameter: 4 mm Liver: No focal lesion identified. Within normal limits in parenchymal echogenicity. Portal vein is patent on color Doppler imaging with normal direction of blood flow towards the liver. Other: None. IMPRESSION: 1. Cholelithiasis and sludge in the gallbladder with wall thickening and a positive Murphy's sign. The constellation of findings is consistent with acute cholecystitis in the appropriate clinical setting. If the clinical picture remains ambiguous, HIDA scan could further  evaluate. These results will be called to the ordering clinician or representative by the Radiologist Assistant, and communication documented in the PACS or [employer]. Electronically Signed   By: Gerome Sam III M.D   On: 06/30/2019 09:15        Scheduled Meds: . enoxaparin (LOVENOX) injection  40 mg Subcutaneous Q24H  . hydroxychloroquine  300 mg Oral Daily  . ketorolac  30 mg Intravenous Q6H  . pantoprazole (PROTONIX) IV  40 mg Intravenous Q24H  . potassium chloride  40 mEq Oral Once   Continuous Infusions: . sodium chloride 100 mL/hr at 07/01/19 1223  . ciprofloxacin 400 mg (07/01/19 1804)  . metronidazole 500 mg (07/01/19 1402)     LOS: 1 day    Time spent:    Erick Blinks, MD [facility]   If 7PM-7AM, please contact night-coverage [email or web address]  07/01/2019, 8:24 PM

## 2019-07-02 LAB — CBC
HCT: 36.1 % (ref 36.0–46.0)
Hemoglobin: 11.9 g/dL — ABNORMAL LOW (ref 12.0–15.0)
MCH: 27.5 pg (ref 26.0–34.0)
MCHC: 33 g/dL (ref 30.0–36.0)
MCV: 83.4 fL (ref 80.0–100.0)
Platelets: 234 10*3/uL (ref 150–400)
RBC: 4.33 MIL/uL (ref 3.87–5.11)
RDW: 13.2 % (ref 11.5–15.5)
WBC: 9.3 10*3/uL (ref 4.0–10.5)
nRBC: 0 % (ref 0.0–0.2)

## 2019-07-02 LAB — BASIC METABOLIC PANEL
Anion gap: 8 (ref 5–15)
BUN: 6 mg/dL (ref 6–20)
CO2: 26 mmol/L (ref 22–32)
Calcium: 7.9 mg/dL — ABNORMAL LOW (ref 8.9–10.3)
Chloride: 105 mmol/L (ref 98–111)
Creatinine, Ser: 0.53 mg/dL (ref 0.44–1.00)
GFR calc Af Amer: >60 mL/min (ref >60)
GFR calc non Af Amer: >60 mL/min (ref >60)
Glucose, Bld: 87 mg/dL (ref 70–99)
Potassium: 3.4 mmol/L — ABNORMAL LOW (ref 3.5–5.1)
Sodium: 139 mmol/L (ref 135–145)

## 2019-07-02 MED ORDER — POTASSIUM CHLORIDE CRYS ER 20 MEQ PO TBCR
40.0000 meq | EXTENDED_RELEASE_TABLET | Freq: Once | ORAL | Status: AC
  Administered 2019-07-02: 40 meq via ORAL
  Filled 2019-07-02: qty 2

## 2019-07-02 MED ORDER — METRONIDAZOLE 500 MG PO TABS
500.0000 mg | ORAL_TABLET | Freq: Three times a day (TID) | ORAL | 0 refills | Status: AC
Start: 2019-07-02 — End: 2019-07-07

## 2019-07-02 MED ORDER — OMEPRAZOLE 40 MG PO CPDR: 40.0000 mg | DELAYED_RELEASE_CAPSULE | Freq: Every day | ORAL | 0 refills | Status: AC | PRN

## 2019-07-02 MED ORDER — CIPROFLOXACIN HCL 500 MG PO TABS
500.0000 mg | ORAL_TABLET | Freq: Two times a day (BID) | ORAL | 0 refills | Status: AC
Start: 2019-07-02 — End: 2019-07-07

## 2019-07-02 MED ORDER — IBUPROFEN 600 MG PO TABS: 600.0000 mg | ORAL_TABLET | Freq: Four times a day (QID) | ORAL | 0 refills | Status: AC | PRN

## 2019-07-02 MED ORDER — ONDANSETRON HCL 4 MG PO TABS: 4.0000 mg | ORAL_TABLET | Freq: Four times a day (QID) | ORAL | 0 refills | Status: AC | PRN

## 2019-07-02 NOTE — Plan of Care (Signed)
  Problem: Education: Goal: Knowledge of General Education information will improve Description: Including pain rating scale, medication(s)/side effects and non-pharmacologic comfort measures 07/02/2019 1502 by Loralie Champagne, RN Outcome: Completed/Met 07/02/2019 1501 by Loralie Champagne, RN Outcome: Progressing   Problem: Health Behavior/Discharge Planning: Goal: Ability to manage health-related needs will improve 07/02/2019 1502 by Loralie Champagne, RN Outcome: Completed/Met 07/02/2019 1501 by Loralie Champagne, RN Outcome: Progressing   Problem: Clinical Measurements: Goal: Ability to maintain clinical measurements within normal limits will improve 07/02/2019 1502 by Loralie Champagne, RN Outcome: Completed/Met 07/02/2019 1501 by Loralie Champagne, RN Outcome: Progressing Goal: Will remain free from infection 07/02/2019 1502 by Loralie Champagne, RN Outcome: Completed/Met 07/02/2019 1501 by Loralie Champagne, RN Outcome: Progressing Goal: Diagnostic test results will improve 07/02/2019 1502 by Loralie Champagne, RN Outcome: Completed/Met 07/02/2019 1501 by Loralie Champagne, RN Outcome: Progressing Goal: Respiratory complications will improve 07/02/2019 1502 by Loralie Champagne, RN Outcome: Completed/Met 07/02/2019 1501 by Loralie Champagne, RN Outcome: Progressing Goal: Cardiovascular complication will be avoided 07/02/2019 1502 by Loralie Champagne, RN Outcome: Completed/Met 07/02/2019 1501 by Loralie Champagne, RN Outcome: Progressing   Problem: Activity: Goal: Risk for activity intolerance will decrease 07/02/2019 1502 by Loralie Champagne, RN Outcome: Completed/Met 07/02/2019 1501 by Loralie Champagne, RN Outcome: Progressing   Problem: Nutrition: Goal: Adequate nutrition will be maintained 07/02/2019 1502 by Loralie Champagne, RN Outcome: Completed/Met 07/02/2019 1501 by Loralie Champagne, RN Outcome: Progressing   Problem: Coping: Goal: Level  of anxiety will decrease 07/02/2019 1502 by Loralie Champagne, RN Outcome: Completed/Met 07/02/2019 1501 by Loralie Champagne, RN Outcome: Progressing   Problem: Elimination: Goal: Will not experience complications related to bowel motility 07/02/2019 1502 by Loralie Champagne, RN Outcome: Completed/Met 07/02/2019 1501 by Loralie Champagne, RN Outcome: Progressing Goal: Will not experience complications related to urinary retention 07/02/2019 1502 by Loralie Champagne, RN Outcome: Completed/Met 07/02/2019 1501 by Loralie Champagne, RN Outcome: Progressing   Problem: Pain Managment: Goal: General experience of comfort will improve 07/02/2019 1502 by Loralie Champagne, RN Outcome: Completed/Met 07/02/2019 1501 by Loralie Champagne, RN Outcome: Progressing   Problem: Safety: Goal: Ability to remain free from injury will improve 07/02/2019 1502 by Loralie Champagne, RN Outcome: Completed/Met 07/02/2019 1501 by Loralie Champagne, RN Outcome: Progressing   Problem: Skin Integrity: Goal: Risk for impaired skin integrity will decrease 07/02/2019 1502 by Loralie Champagne, RN Outcome: Completed/Met 07/02/2019 1501 by Loralie Champagne, RN Outcome: Progressing

## 2019-07-02 NOTE — Progress Notes (Signed)
Nsg Discharge Note  Admit Date:  06/29/2019 Discharge date: 07/02/2019   Linda Spence to be D/C'd Home per MD order.  AVS completed.  Copy for chart, and copy for patient signed, and dated. Patient/caregiver able to verbalize understanding.  Discharge Medication: Allergies as of 07/02/2019      Reactions   Amoxicillin    Really sick to stomach   Onion Other (See Comments)   "Feels like throat is burning"   Tylenol [acetaminophen]    Stage 3 liver disease   Doxycycline Itching      Medication List    STOP taking these medications   ADVIL MIGRAINE PO Replaced by: ibuprofen 600 MG tablet   metoprolol succinate 25 MG 24 hr tablet Commonly known as: Toprol XL     TAKE these medications   ALPRAZolam 1 MG tablet Commonly known as: XANAX Take 1 mg by mouth 4 (four) times daily as needed for anxiety or sleep.   ciprofloxacin 500 MG tablet Commonly known as: Cipro Take 1 tablet (500 mg total) by mouth 2 (two) times daily for 5 days.   clobetasol 0.05 % external solution Commonly known as: TEMOVATE Apply 1 application topically daily.   dicyclomine 20 MG tablet Commonly known as: Bentyl Take 1 tablet (20 mg total) by mouth every 6 (six) hours as needed for spasms (abdominal cramping).   hydroxychloroquine 200 MG tablet Commonly known as: PLAQUENIL Take 300 mg by mouth daily.   ibuprofen 600 MG tablet Commonly known as: ADVIL Take 1 tablet (600 mg total) by mouth every 6 (six) hours as needed. Replaces: ADVIL MIGRAINE PO   metroNIDAZOLE 500 MG tablet Commonly known as: Flagyl Take 1 tablet (500 mg total) by mouth 3 (three) times daily for 5 days.   nortriptyline 10 MG capsule Commonly known as: PAMELOR Take by mouth.   omeprazole 40 MG capsule Commonly known as: PRILOSEC Take 1 capsule (40 mg total) by mouth daily as needed.   ondansetron 4 MG tablet Commonly known as: Zofran Take 1 tablet (4 mg total) by mouth every 6 (six) hours as needed for nausea or  vomiting.   Proventil HFA 108 (90 Base) MCG/ACT inhaler Generic drug: albuterol Inhale 2 puffs into the lungs every 6 (six) hours as needed for wheezing or shortness of breath.       Discharge Assessment: Vitals:   07/01/19 2101 07/02/19 0511  BP: 112/72 106/66  Pulse: 66 (!) 56  Resp:  18  Temp: 98.7 F (37.1 C) 98.6 F (37 C)  SpO2: 98% 98%   Skin clean, dry and intact without evidence of skin break down, no evidence of skin tears noted. IV catheter discontinued intact. Site without signs and symptoms of complications - no redness or edema noted at insertion site, patient denies c/o pain - only slight tenderness at site.  Dressing with slight pressure applied.  D/c Instructions-Education: Discharge instructions given to patient/family with verbalized understanding. D/c education completed with patient/family including follow up instructions, medication list, d/c activities limitations if indicated, with other d/c instructions as indicated by MD - patient able to verbalize understanding, all questions fully answered. Patient instructed to return to ED, call 911, or call MD for any changes in condition.  Patient escorted via WC, and D/C home via private auto.  Brandy Hale, LPN 07/03/1599 0:93 PM

## 2019-07-02 NOTE — Discharge Instructions (Signed)
Cholecystitis  Cholecystitis is inflammation of the gallbladder. It is often called a gallbladder attack. The gallbladder is a pear-shaped organ that lies beneath the liver on the right side of the body. The gallbladder stores bile, which is a fluid that helps the body digest fats. If bile builds up in your gallbladder, your gallbladder becomes inflamed. This condition may occur suddenly. Cholecystitis is a serious condition and requires treatment. What are the causes? The most common cause of this condition is gallstones. Gallstones can block the tube (duct) that carries bile out of your gallbladder. This causes bile to build up. Other causes include:  Damage to the gallbladder due to a decrease in blood flow.  Infections in the bile ducts.  Scars or kinks in the bile ducts.  Tumors in the liver, pancreas, or gallbladder. What increases the risk? You are more likely to develop this condition if:  You have sickle cell disease.  You take birth control pills or use estrogen.  You have alcoholic liver disease.  You have liver cirrhosis.  You have your nutrition delivered through a vein (parenteral nutrition).  You are critically ill.  You do not eat or drink for a long time. This is also called "fasting."  You are obese.  You lose weight too fast.  You are pregnant.  You have high levels of fat (triglycerides) in the blood.  You have pancreatitis. What are the signs or symptoms? Symptoms of this condition include:  Pain in the abdomen, especially in the upper right area of the abdomen.  Tenderness or bloating in the abdomen.  Nausea.  Vomiting.  Fever.  Chills. How is this diagnosed? This condition is diagnosed with a medical history and physical exam. You may also have other tests, including:  Imaging tests, such as: ? An ultrasound of the gallbladder. ? A CT scan of the abdomen. ? A gallbladder nuclear scan (HIDA scan). This scan allows your health care  provider to see the bile moving from your liver to your gallbladder and on to your small intestine. ? MRI.  Blood tests, such as: ? A complete blood count. The [place] blood cell count may be higher than normal. ? Liver function tests. Certain types of gallstones cause some results to be higher than normal. How is this treated? Treatment may include:  Surgery to remove your gallbladder (cholecystectomy).  Antibiotic medicine, usually through an IV.  Fasting for a certain amount of time.  Giving IV fluids.  Medicine to treat pain or vomiting. Follow these instructions at home:  If you had surgery, follow instructions from your health care provider about home care after the procedure. Medicines   Take over-the-counter and prescription medicines only as told by your health care provider.  If you were prescribed an antibiotic medicine, take it as told by your health care provider. Do not stop taking the antibiotic even if you start to feel better. General instructions  Follow instructions from your health care provider about what to eat or drink. When you are allowed to eat, avoid eating or drinking anything that triggers your symptoms.  Do not lift anything that is heavier than 10 lb (4.5 kg), or the limit that you are told, until your health care provider says that it is safe.  Do not use any products that contain nicotine or tobacco, such as cigarettes and e-cigarettes. If you need help quitting, ask your health care provider.  Keep all follow-up visits as told by your health care provider. This is   important. Contact a health care provider if:  Your pain is not controlled with medicine.  You have a fever. Get help right away if:  Your pain moves to another part of your abdomen or to your back.  You continue to have symptoms or you develop new symptoms even with treatment. Summary  Cholecystitis is inflammation of the gallbladder.  The most common cause of this condition  is gallstones. Gallstones can block the tube (duct) that carries bile out of your gallbladder.  Common symptoms are pain in the abdomen, nausea, vomiting, fever, and chills.  This condition is treated with surgery to remove the gallbladder, medicines, fasting, and IV fluids.  Follow your health care provider's instructions for eating and drinking. Avoid eating anything that triggers your symptoms. This information is not intended to replace advice given to you by your health care provider. Make sure you discuss any questions you have with your health care provider. Document Revised: 04/28/2017 Document Reviewed: 04/28/2017 Elsevier Patient Education  2020 [employer]  

## 2019-07-02 NOTE — Plan of Care (Signed)

## 2019-07-02 NOTE — Progress Notes (Signed)
Subjective: Patient's abdominal pain has significantly eased.  She is hungry.  She wants to go home.  Objective: Vital signs in last 24 hours: Temp:  [98.6 F (37 C)-98.9 F (37.2 C)] 98.6 F (37 C) (06/29 0511) Pulse Rate:  [56-68] 56 (06/29 0511) Resp:  [16-18] 18 (06/29 0511) BP: (106-116)/(66-72) 106/66 (06/29 0511) SpO2:  [98 %-100 %] 98 % (06/29 0511) Last BM Date: 06/28/19  Intake/Output from previous day: 06/28 0701 - 06/29 0700 In: 795.3 [I.V.:695.2; IV Piggyback:100.1] Out: -  Intake/Output this shift: No intake/output data recorded.  General appearance: alert, cooperative and no distress GI: Soft, no significant pain in the right upper quadrant to palpation.  No rigidity is noted.  Lab Results:  Recent Labs    07/01/19 0410 07/02/19 0551  WBC 13.3* 9.3  HGB 12.6 11.9*  HCT 37.4 36.1  PLT 224 234   BMET Recent Labs    07/01/19 0410 07/02/19 0551  NA 137 139  K 3.3* 3.4*  CL 102 105  CO2 25 26  GLUCOSE 96 87  BUN 7 6  CREATININE 0.59 0.53  CALCIUM 8.1* 7.9*   PT/INR No results for input(s): LABPROT, INR in the last 72 hours.  Studies/Results: US ABDOMEN LIMITED RUQ  Result Date: 06/30/2019 CLINICAL DATA:  Possible cholecystitis on CT scan.  Pain. EXAM: ULTRASOUND ABDOMEN LIMITED RIGHT UPPER QUADRANT COMPARISON:  CT scan June 30, 2019 FINDINGS: Gallbladder: There are stones and sludge in the gallbladder with wall thickening measuring 7 mm and a positive Murphy's sign. Common bile duct: Diameter: 4 mm Liver: No focal lesion identified. Within normal limits in parenchymal echogenicity. Portal vein is patent on color Doppler imaging with normal direction of blood flow towards the liver. Other: None. IMPRESSION: 1. Cholelithiasis and sludge in the gallbladder with wall thickening and a positive Murphy's sign. The constellation of findings is consistent with acute cholecystitis in the appropriate clinical setting. If the clinical picture remains  ambiguous, HIDA scan could further evaluate. These results will be called to the ordering clinician or representative by the Radiologist Assistant, and communication documented in the PACS or [employer]. Electronically Signed   By: Gerome Sam III M.D   On: 06/30/2019 09:15    Anti-infectives: Anti-infectives (From admission, onward)   Start     Dose/Rate Route Frequency Ordered Stop   06/30/19 1800  ciprofloxacin (CIPRO) IVPB 400 mg     Discontinue     400 mg 200 mL/hr over 60 Minutes Intravenous Every 12 hours 06/30/19 1126     06/30/19 1400  metroNIDAZOLE (FLAGYL) IVPB 500 mg     Discontinue     500 mg 100 mL/hr over 60 Minutes Intravenous Every 8 hours 06/30/19 1114     06/30/19 1130  hydroxychloroquine (PLAQUENIL) tablet 300 mg     Discontinue     300 mg Oral Daily 06/30/19 1114     06/30/19 0530  ciprofloxacin (CIPRO) IVPB 400 mg       "And" Linked Group Details   400 mg 200 mL/hr over 60 Minutes Intravenous  Once 06/30/19 0518 06/30/19 0656   06/30/19 0530  metroNIDAZOLE (FLAGYL) IVPB 500 mg       "And" Linked Group Details   500 mg 100 mL/hr over 60 Minutes Intravenous  Once 06/30/19 0518 06/30/19 0656      Assessment/Plan: Impression: Cholecystitis secondary to cholelithiasis, cocaine use.  Clinically improved.  Decelles blood cell count normalized.  LFTs within normal limits. Plan: Will advance diet.  Okay  for discharge from surgery standpoint.  Would continue ciprofloxacin for 5 days.  She will follow-up in my office next week to schedule surgery.  LOS: 2 days    Franky Macho 07/02/2019

## 2019-07-03 NOTE — Discharge Summary (Signed)
Physician Discharge Summary  Linda Spence WGY:[id number] DOB: [birth year] DOA: 06/29/2019  PCP: Elfredia Nevins, MD  Admit date: 06/29/2019 Discharge date: 07/02/2019  Admitted From: Home Disposition: Home  Recommendations for Outpatient Follow-up:  1. Follow up with PCP in 1-2 weeks 2. Please obtain BMP/CBC in one week 3. Follow-up with general surgery as an outpatient for elective cholecystectomy  Discharge Condition: Stable CODE STATUS: Full code Diet recommendation: Heart healthy  Brief/Interim Summary: XBL:[id number] E Whiteis a 24 y.o.femalewith medical history significant oflupus on Plaquenil, hepatitis C, is brought to the hospital with abdominal pain, nausea and vomiting. She reports having the symptoms for the last few days. She is unsure whether she has had any fevers. She describes substernal chest pain that is burning type and worse with vomiting. Vomitus is green in color. She is not had any diarrhea. Abdominal pain is worse with p.o. intake. She He was evaluated in the emergency room where LFTs were noted to be normal. WBC count elevated at 15,000. CT scan of the abdomen pelvis was performed that showed cholelithiasis and gallbladder wall thickening. She subsequently had a abdominal ultrasound that showed positive Murphy sign, cholelithiasis, sludge and gallbladder wall thickening. Urine toxicology screen is positive for benzodiazepines, opiates, cannabis, amphetamines, and cocaine. She was referred for admission.  Discharge Diagnoses:  Active Problems:   Chronic hepatitis C without hepatic coma (HCC)   Cholecystitis   Substance abuse (HCC)   SLE (systemic lupus erythematosus related syndrome) (HCC)   Calculus of gallbladder with acute cholecystitis without obstruction  1. Acute cholecystitis. Patient was treated with ciprofloxacin and Flagyl, antiemetics and bowel rest.  Her diet was slowly advanced and she is now tolerating a solid diet.  Overall  abdominal pain has improved.   She was seen by general surgery.  Since her urine toxicology screen was positive for cocaine, it was felt that her surgery would need to be postponed until tox screen was negative.  Patient plans to follow-up with general surgery as an outpatient to pursue elective cholecystectomy. 2. SLE. Continue on Plaquenil. She is followed at [facility]. 3. Substance abuse. Patient's urine toxicology screen was positive for multiple agents. 4. Chronic hepatitis C. Follows with gastroenterology. LFTs are currently normal.  Discharge Instructions  Discharge Instructions    Diet - low sodium heart healthy   Complete by: As directed    Increase activity slowly   Complete by: As directed      Allergies as of 07/02/2019      Reactions   Amoxicillin    Really sick to stomach   Onion Other (See Comments)   "Feels like throat is burning"   Tylenol [acetaminophen]    Stage 3 liver disease   Doxycycline Itching      Medication List    STOP taking these medications   ADVIL MIGRAINE PO Replaced by: ibuprofen 600 MG tablet   metoprolol succinate 25 MG 24 hr tablet Commonly known as: Toprol XL     TAKE these medications   ALPRAZolam 1 MG tablet Commonly known as: XANAX Take 1 mg by mouth 4 (four) times daily as needed for anxiety or sleep.   ciprofloxacin 500 MG tablet Commonly known as: Cipro Take 1 tablet (500 mg total) by mouth 2 (two) times daily for 5 days.   clobetasol 0.05 % external solution Commonly known as: TEMOVATE Apply 1 application topically daily.   dicyclomine 20 MG tablet Commonly known as: Bentyl Take 1 tablet (20 mg total) by mouth every  6 (six) hours as needed for spasms (abdominal cramping).   hydroxychloroquine 200 MG tablet Commonly known as: PLAQUENIL Take 300 mg by mouth daily.   ibuprofen 600 MG tablet Commonly known as: ADVIL Take 1 tablet (600 mg total) by mouth every 6 (six) hours as needed. Replaces: ADVIL  MIGRAINE PO   metroNIDAZOLE 500 MG tablet Commonly known as: Flagyl Take 1 tablet (500 mg total) by mouth 3 (three) times daily for 5 days.   nortriptyline 10 MG capsule Commonly known as: PAMELOR Take by mouth.   omeprazole 40 MG capsule Commonly known as: PRILOSEC Take 1 capsule (40 mg total) by mouth daily as needed.   ondansetron 4 MG tablet Commonly known as: Zofran Take 1 tablet (4 mg total) by mouth every 6 (six) hours as needed for nausea or vomiting.   Proventil HFA 108 (90 Base) MCG/ACT inhaler Generic drug: albuterol Inhale 2 puffs into the lungs every 6 (six) hours as needed for wheezing or shortness of breath.       Follow-up Information    Franky Macho, MD. Schedule an appointment as soon as possible for a visit on 07/09/2019.   Specialty: General Surgery Why: appointment time 2:15PM on 07-09-2019 Contact information: [address] [place] [address] [phone]        Elfredia Nevins, MD Follow up on 07/10/2019.   Specialty: Internal Medicine Why: appointment time 10:45 am Contact information: [address] [place] [address] [phone]              Allergies  Allergen Reactions  . Amoxicillin     Really sick to stomach  . Onion Other (See Comments)    "Feels like throat is burning"  . Tylenol [Acetaminophen]     Stage 3 liver disease  . Doxycycline Itching    Consultations:  General surgery   Procedures/Studies: CT Abdomen Pelvis Wo Contrast  Result Date: 06/30/2019 CLINICAL DATA:  Abdominal distension EXAM: CT ABDOMEN AND PELVIS WITHOUT CONTRAST TECHNIQUE: Multidetector CT imaging of the abdomen and pelvis was performed following the standard protocol without IV contrast. COMPARISON:  None. FINDINGS: LOWER CHEST: Normal. HEPATOBILIARY: Normal hepatic contours. No intra- or extrahepatic biliary dilatation. There is cholelithiasis with diffuse gallbladder wall thickening. PANCREAS: Normal pancreas. No ductal  dilatation or peripancreatic fluid collection. SPLEEN: Normal. ADRENALS/URINARY TRACT: The adrenal glands are normal. No hydronephrosis, nephroureterolithiasis or solid renal mass. The urinary bladder is normal for degree of distention STOMACH/BOWEL: There is no hiatal hernia. Normal duodenal course and caliber. No small bowel dilatation or inflammation. No focal colonic abnormality. The appendix is not visualized. No right lower quadrant inflammation or free fluid. VASCULAR/LYMPHATIC: Normal course and caliber of the major abdominal vessels. No abdominal or pelvic lymphadenopathy. REPRODUCTIVE: Normal uterus. No adnexal mass. MUSCULOSKELETAL. No bony spinal canal stenosis or focal osseous abnormality. OTHER: None. IMPRESSION: Cholelithiasis with diffuse gallbladder wall thickening, concerning for acute cholecystitis. Electronically Signed   By: Deatra Robinson M.D.   On: 06/30/2019 05:00   US ABDOMEN LIMITED RUQ  Result Date: 06/30/2019 CLINICAL DATA:  Possible cholecystitis on CT scan.  Pain. EXAM: ULTRASOUND ABDOMEN LIMITED RIGHT UPPER QUADRANT COMPARISON:  CT scan June 30, 2019 FINDINGS: Gallbladder: There are stones and sludge in the gallbladder with wall thickening measuring 7 mm and a positive Murphy's sign. Common bile duct: Diameter: 4 mm Liver: No focal lesion identified. Within normal limits in parenchymal echogenicity. Portal vein is patent on color Doppler imaging with normal direction of blood flow towards the liver. Other:  None. IMPRESSION: 1. Cholelithiasis and sludge in the gallbladder with wall thickening and a positive Murphy's sign. The constellation of findings is consistent with acute cholecystitis in the appropriate clinical setting. If the clinical picture remains ambiguous, HIDA scan could further evaluate. These results will be called to the ordering clinician or representative by the Radiologist Assistant, and communication documented in the PACS or [employer]. Electronically  Signed   By: Gerome Samavid  Williams III M.D   On: 06/30/2019 09:15       Subjective: Abdominal pain has improved.  No nausea or vomiting.  Tolerating solid diet.  Discharge Exam: Vitals:   07/01/19 1456 07/01/19 2005 07/01/19 2101 07/02/19 0511  BP: 116/72  112/72 106/66  Pulse: 68  66 (!) 56  Resp: 16   18  Temp: 98.9 F (37.2 C)  98.7 F (37.1 C) 98.6 F (37 C)  TempSrc: Oral  Oral Oral  SpO2: 100% 100% 98% 98%  Weight:      Height:        General: Pt is alert, awake, not in acute distress Cardiovascular: RRR, S1/S2 +, no rubs, no gallops Respiratory: CTA bilaterally, no wheezing, no rhonchi Abdominal: Soft, NT, ND, bowel sounds + Extremities: no edema, no cyanosis    The results of significant diagnostics from this hospitalization (including imaging, microbiology, ancillary and laboratory) are listed below for reference.     Microbiology: Recent Results (from the past 240 hour(s))  SARS Coronavirus 2 by RT PCR (hospital order, performed in [facility]) Nasopharyngeal Nasopharyngeal Swab     Status: None   Collection Time: 06/30/19  6:25 AM   Specimen: Nasopharyngeal Swab  Result Value Ref Range Status   SARS Coronavirus 2 NEGATIVE NEGATIVE Final    Comment: (NOTE) SARS-CoV-2 target nucleic acids are NOT DETECTED.  The SARS-CoV-2 RNA is generally detectable in upper and lower respiratory specimens during the acute phase of infection. The lowest concentration of SARS-CoV-2 viral copies this assay can detect is 250 copies / mL. A negative result does not preclude SARS-CoV-2 infection and should not be used as the sole basis for treatment or other patient management decisions.  A negative result may occur with improper specimen collection / handling, submission of specimen other than nasopharyngeal swab, presence of viral mutation(s) within the areas targeted by this assay, and inadequate number of viral copies (<250 copies / mL). A negative result must be  combined with clinical observations, patient history, and epidemiological information.  Fact Sheet for Patients:   BoilerBrush.com.cyhttps://www.fda.gov/media/136312/download  Fact Sheet for Healthcare Providers: https://pope.com/https://www.fda.gov/media/136313/download  This test is not yet approved or  cleared by the [place] FDA and has been authorized for detection and/or diagnosis of SARS-CoV-2 by FDA under an Emergency Use Authorization (EUA).  This EUA will remain in effect (meaning this test can be used) for the duration of the COVID-19 declaration under Section 564(b)(1) of the Act, 21 U.S.C. section 360bbb-3(b)(1), unless the authorization is terminated or revoked sooner.  Performed at [facility], [address] [place], [place] [id number]      Labs: BNP (last 3 results) No results for input(s): BNP in the last 8760 hours. Basic Metabolic Panel: Recent Labs  Lab 06/29/19 2135 07/01/19 0410 07/02/19 0551  NA 133* 137 139  K 4.5 3.3* 3.4*  CL 99 102 105  CO2 25 25 26   GLUCOSE 105* 96 87  BUN 6 7 6   CREATININE 0.56 0.59 0.53  CALCIUM 9.2 8.1* 7.9*   Liver Function Tests:  Recent Labs  Lab 06/29/19 2135 07/01/19 0410  AST 31 25  ALT 41 40  ALKPHOS 99 83  BILITOT 0.4 0.6  PROT 7.6 5.9*  ALBUMIN 4.1 3.0*   Recent Labs  Lab 06/29/19 2135  LIPASE 17   No results for input(s): AMMONIA in the last 168 hours. CBC: Recent Labs  Lab 06/29/19 2135 07/01/19 0410 07/02/19 0551  WBC 15.5* 13.3* 9.3  HGB 15.3* 12.6 11.9*  HCT 45.7 37.4 36.1  MCV 80.7 81.7 83.4  PLT 268 224 234   Cardiac Enzymes: No results for input(s): CKTOTAL, CKMB, CKMBINDEX, TROPONINI in the last 168 hours. BNP: Invalid input(s): POCBNP CBG: No results for input(s): GLUCAP in the last 168 hours. D-Dimer No results for input(s): DDIMER in the last 72 hours. Hgb A1c No results for input(s): HGBA1C in the last 72 hours. Lipid Profile No results for input(s): CHOL, HDL, LDLCALC, TRIG, CHOLHDL, LDLDIRECT in  the last 72 hours. Thyroid function studies No results for input(s): TSH, T4TOTAL, T3FREE, THYROIDAB in the last 72 hours.  Invalid input(s): FREET3 Anemia work up No results for input(s): VITAMINB12, FOLATE, FERRITIN, TIBC, IRON, RETICCTPCT in the last 72 hours. Urinalysis    Component Value Date/Time   COLORURINE YELLOW 06/29/2019 2046   APPEARANCEUR HAZY (A) 06/29/2019 2046   LABSPEC 1.018 06/29/2019 2046   PHURINE 8.0 06/29/2019 2046   GLUCOSEU NEGATIVE 06/29/2019 2046   HGBUR SMALL (A) 06/29/2019 2046   BILIRUBINUR NEGATIVE 06/29/2019 2046   KETONESUR NEGATIVE 06/29/2019 2046   PROTEINUR 30 (A) 06/29/2019 2046   UROBILINOGEN 0.2 06/10/2010 1427   NITRITE NEGATIVE 06/29/2019 2046   LEUKOCYTESUR TRACE (A) 06/29/2019 2046   Sepsis Labs Invalid input(s): PROCALCITONIN,  WBC,  LACTICIDVEN Microbiology Recent Results (from the past 240 hour(s))  SARS Coronavirus 2 by RT PCR (hospital order, performed in [facility] hospital lab) Nasopharyngeal Nasopharyngeal Swab     Status: None   Collection Time: 06/30/19  6:25 AM   Specimen: Nasopharyngeal Swab  Result Value Ref Range Status   SARS Coronavirus 2 NEGATIVE NEGATIVE Final    Comment: (NOTE) SARS-CoV-2 target nucleic acids are NOT DETECTED.  The SARS-CoV-2 RNA is generally detectable in upper and lower respiratory specimens during the acute phase of infection. The lowest concentration of SARS-CoV-2 viral copies this assay can detect is 250 copies / mL. A negative result does not preclude SARS-CoV-2 infection and should not be used as the sole basis for treatment or other patient management decisions.  A negative result may occur with improper specimen collection / handling, submission of specimen other than nasopharyngeal swab, presence of viral mutation(s) within the areas targeted by this assay, and inadequate number of viral copies (<250 copies / mL). A negative result must be combined with clinical observations, patient  history, and epidemiological information.  Fact Sheet for Patients:   BoilerBrush.com.cy  Fact Sheet for Healthcare Providers: https://pope.com/  This test is not yet approved or  cleared by the [place] FDA and has been authorized for detection and/or diagnosis of SARS-CoV-2 by FDA under an Emergency Use Authorization (EUA).  This EUA will remain in effect (meaning this test can be used) for the duration of the COVID-19 declaration under Section 564(b)(1) of the Act, 21 U.S.C. section 360bbb-3(b)(1), unless the authorization is terminated or revoked sooner.  Performed at [facility], [address] [place], [place] [address]      Time coordinating discharge:  SIGNED:   Erick Blinks, MD  [facility] 07/03/2019, 1:55  PM   If 7PM-7AM, please contact night-coverage [email or web address]

## 2019-07-09 ENCOUNTER — Ambulatory Visit: Payer: Medicaid Other | Admitting: General Surgery

## 2020-12-22 IMAGING — CT CT ABD-PELV W/O CM
2 of 4 series · 17 of 46 positions shown, 19 images · non-contrast
Comparison: None.

CLINICAL DATA: Abdominal distension

EXAM:
CT ABDOMEN AND PELVIS WITHOUT CONTRAST
TECHNIQUE: Multidetector CT imaging of the abdomen and pelvis was performed
following the standard protocol without IV contrast.

[Series 2: axial st · axial · 0.78mm/px · z∈[+850,+1246]mm · 14 of 91 slices shown, 16 images]
[im 6/91  soft-tissue]
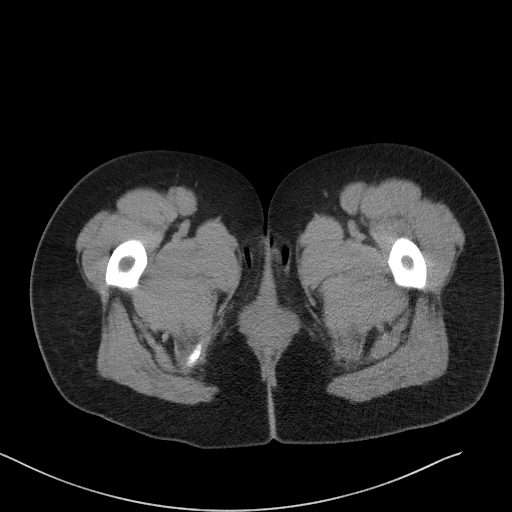
[im 6/91  bone]
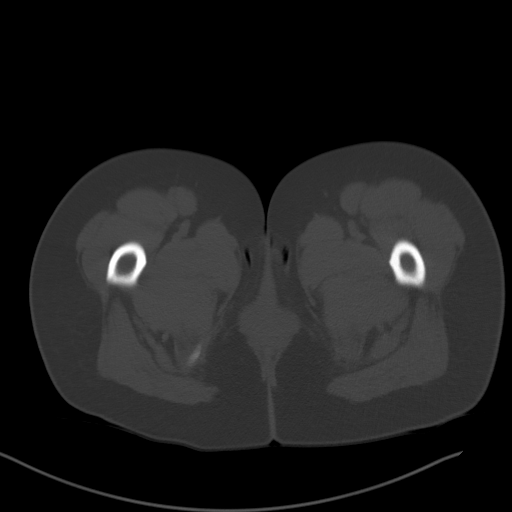
[im 11/91  soft-tissue]
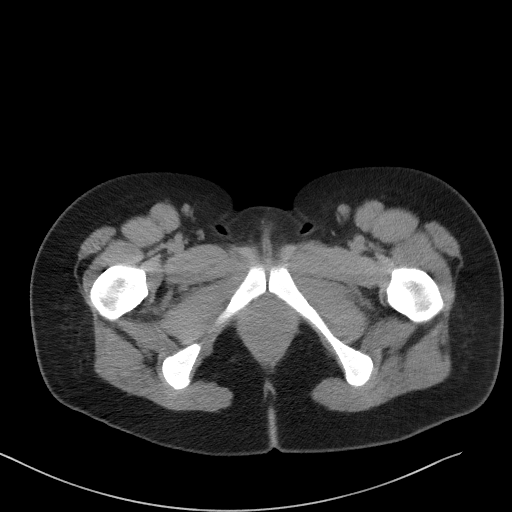
[im 16/91  soft-tissue]
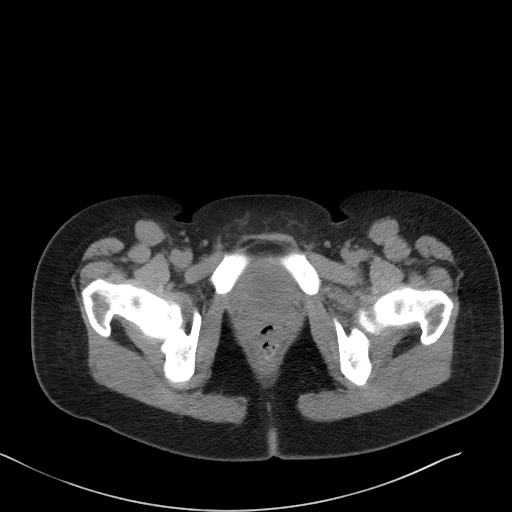
[im 27/91  soft-tissue]
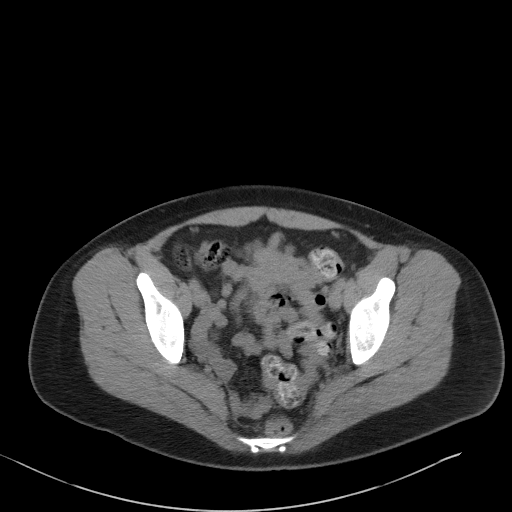
[im 32/91  soft-tissue]
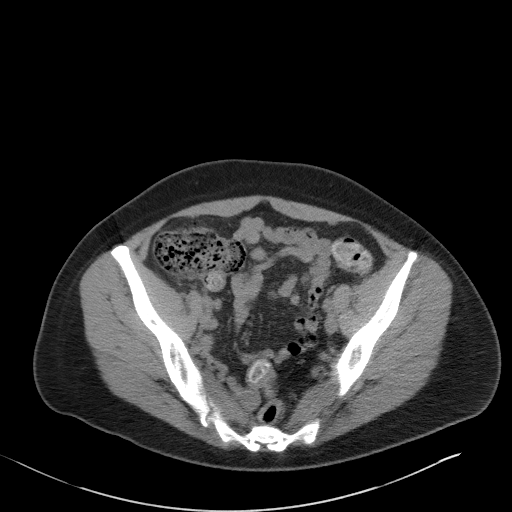
[im 38/91  soft-tissue]
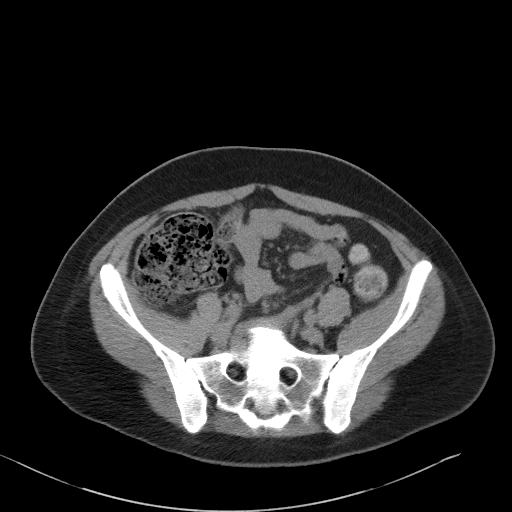
[im 43/91  soft-tissue]
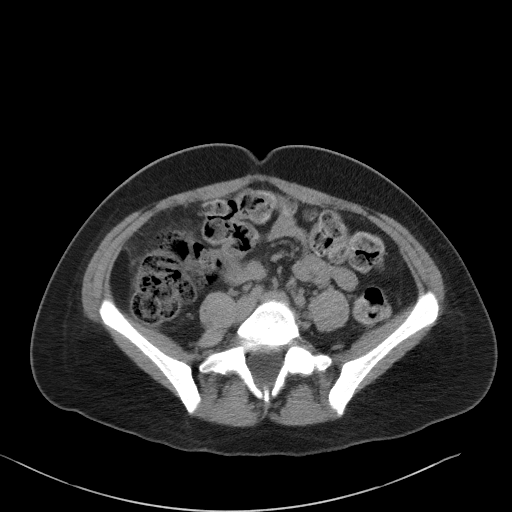
[im 48/91  soft-tissue]
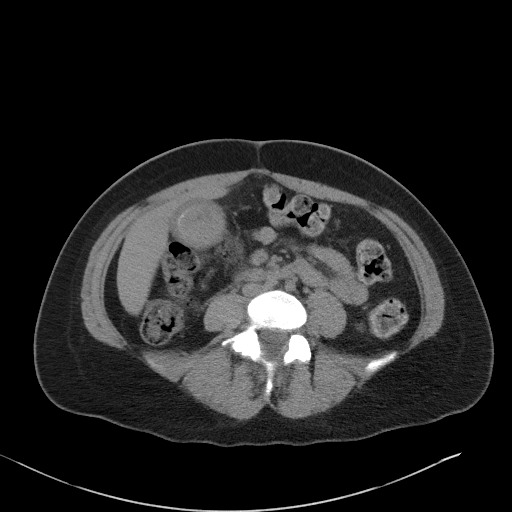
[im 53/91  soft-tissue]
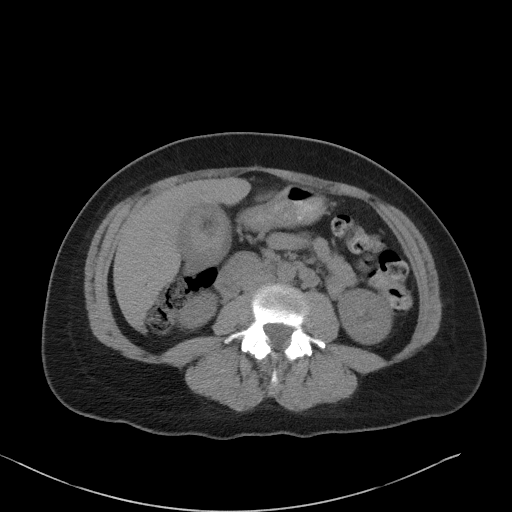
[im 53/91  bone]
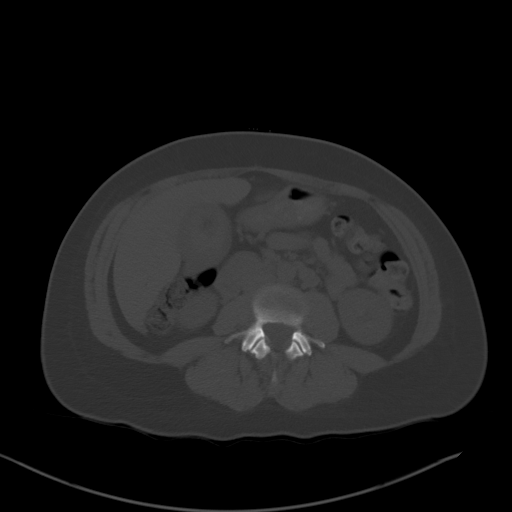
[im 59/91  soft-tissue]
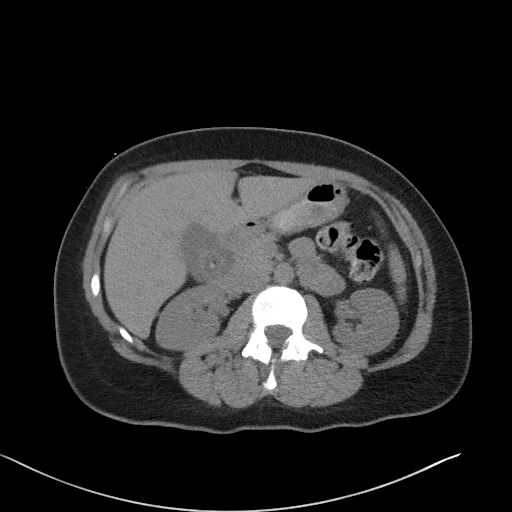
[im 69/91  soft-tissue]
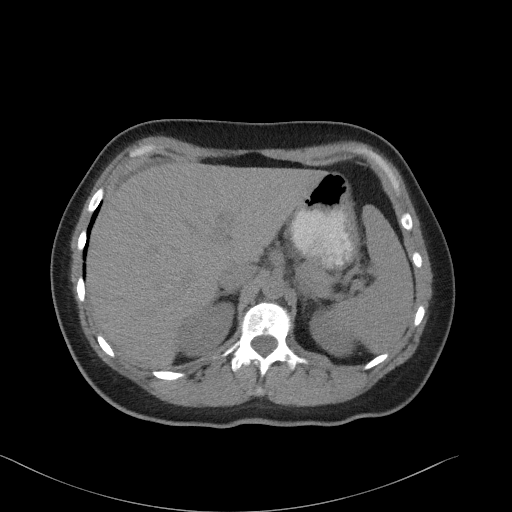
[im 75/91  soft-tissue]
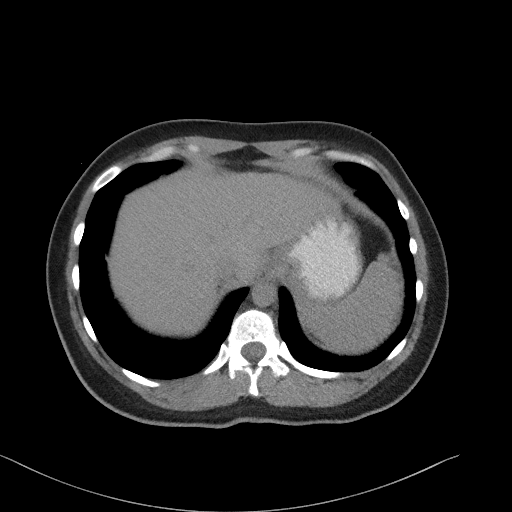
[im 80/91  soft-tissue]
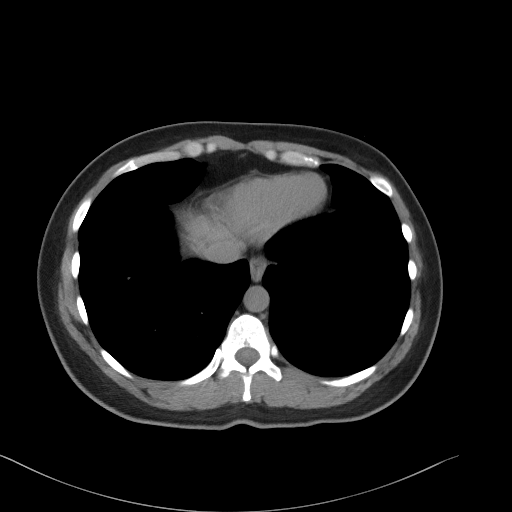
[im 85/91  soft-tissue]
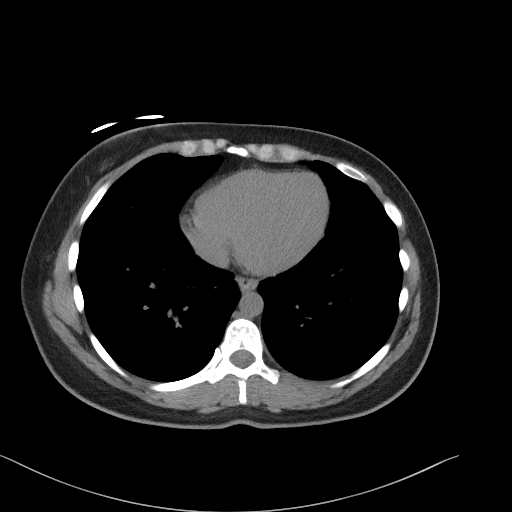

[Series 5: coronal st · coronal · 0.86mm/px · 3 of 99 slices shown]
[im 33/99  soft-tissue]
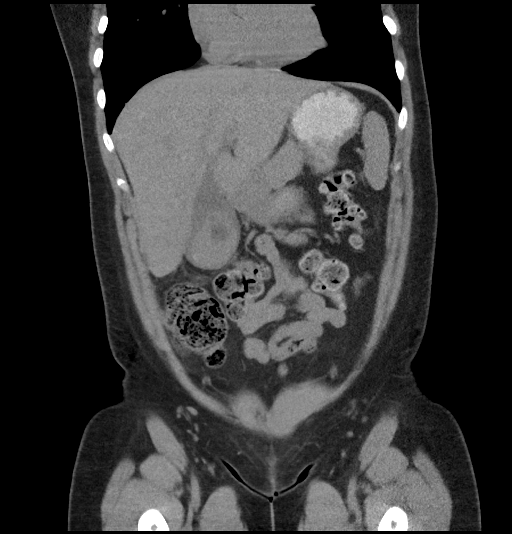
[im 44/99  soft-tissue]
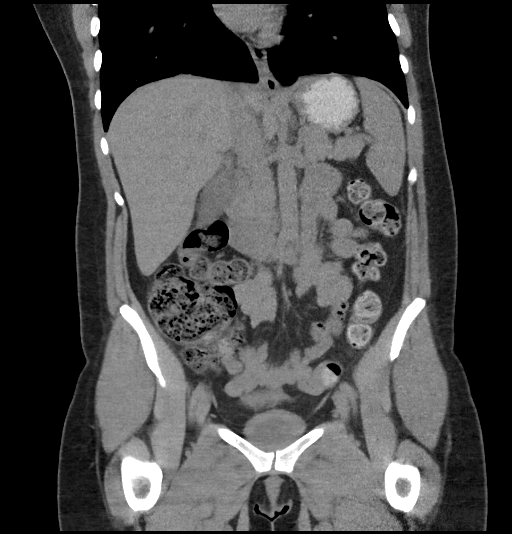
[im 55/99  soft-tissue]
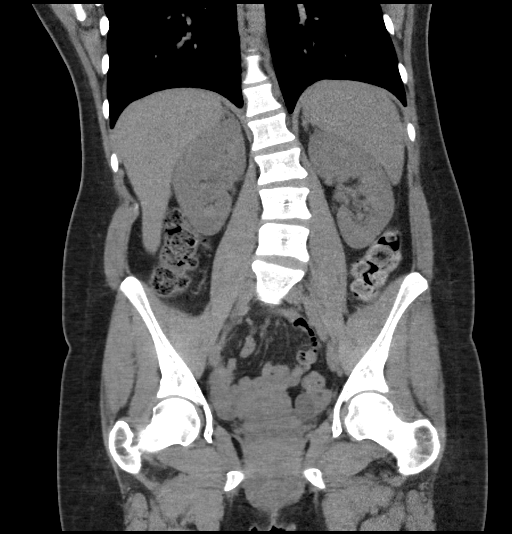

[17 of 46 positions shown; findings below may reference images not displayed]

FINDINGS: LOWER CHEST: Normal.

HEPATOBILIARY: Normal hepatic contours. No intra- or extrahepatic
biliary dilatation. There is cholelithiasis with diffuse gallbladder
wall thickening.

PANCREAS: Normal pancreas. No ductal dilatation or peripancreatic
fluid collection.

SPLEEN: Normal.

ADRENALS/URINARY TRACT: The adrenal glands are normal. No
hydronephrosis, nephroureterolithiasis or solid renal mass. The
urinary bladder is normal for degree of distention

STOMACH/BOWEL: There is no hiatal hernia. Normal duodenal course and
caliber. No small bowel dilatation or inflammation. No focal colonic
abnormality. The appendix is not visualized. No right lower quadrant
inflammation or free fluid.

VASCULAR/LYMPHATIC: Normal course and caliber of the major abdominal
vessels. No abdominal or pelvic lymphadenopathy.

REPRODUCTIVE: Normal uterus. No adnexal mass.

MUSCULOSKELETAL. No bony spinal canal stenosis or focal osseous
abnormality.

OTHER: None.
IMPRESSION: Cholelithiasis with diffuse gallbladder wall thickening, concerning
for acute cholecystitis.

## 2020-12-22 IMAGING — US US ABDOMEN LIMITED
1 series · 14 of 25 positions shown · non-contrast
Comparison: CT scan June 30, 2019

CLINICAL DATA: Possible cholecystitis on CT scan.  Pain.

EXAM:
ULTRASOUND ABDOMEN LIMITED RIGHT UPPER QUADRANT

[Series 1: us abdomen limited ruq · 14 of 46 slices shown]
[im 1/46]
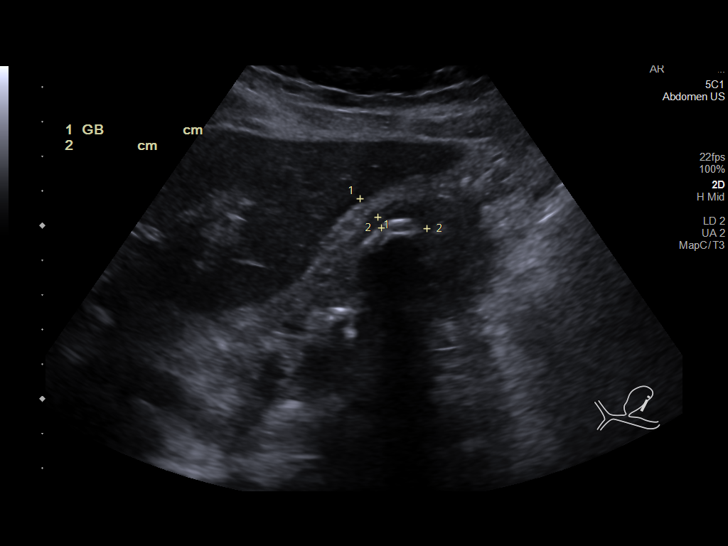
[im 4/46]
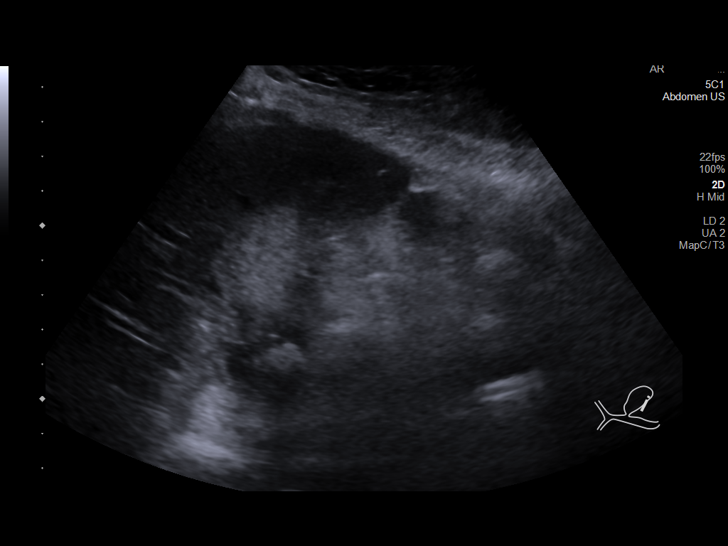
[im 8/46]
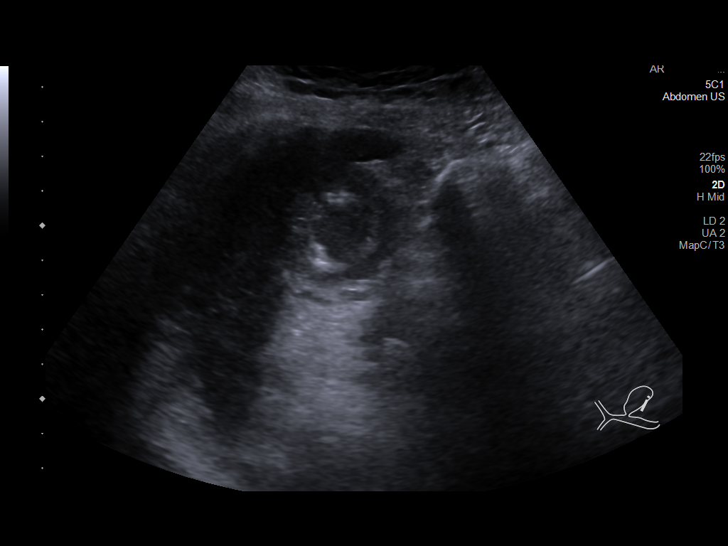
[im 12/46]
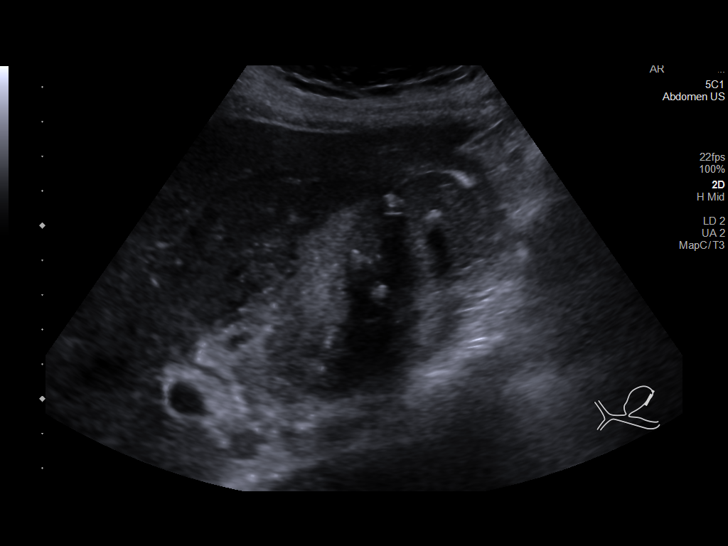
[im 16/46]
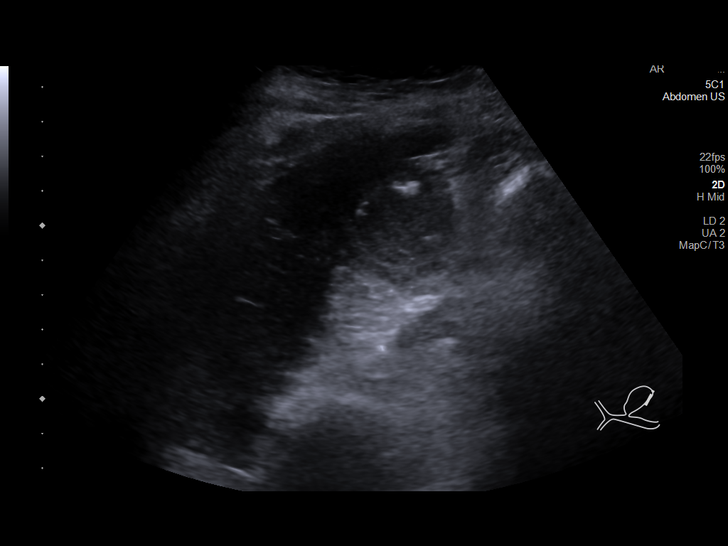
[im 17/46]
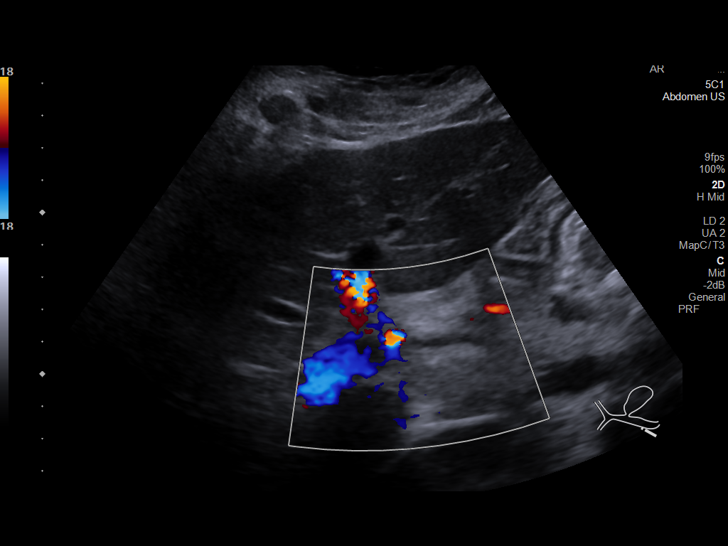
[im 21/46]
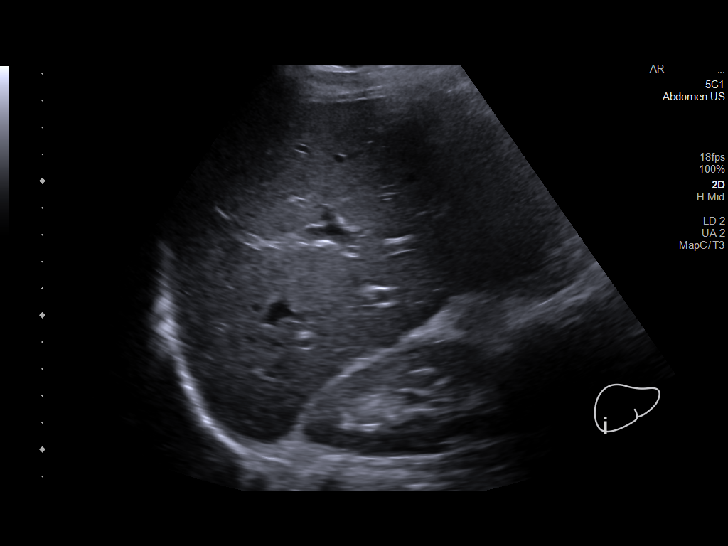
[im 25/46]
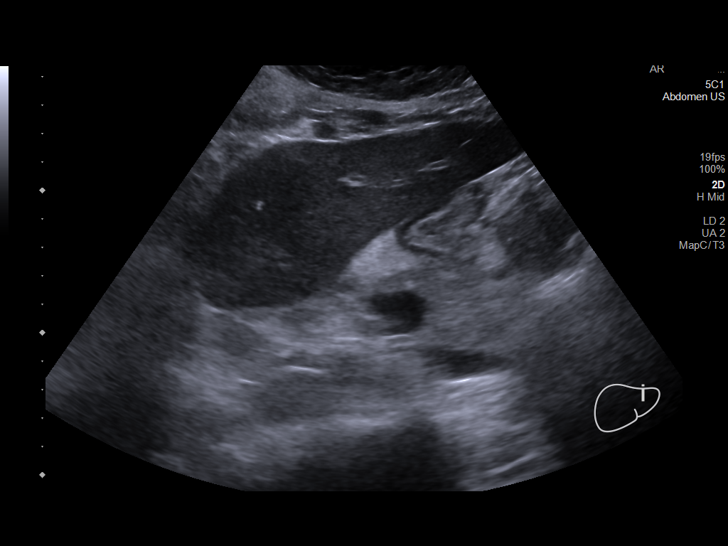
[im 29/46]
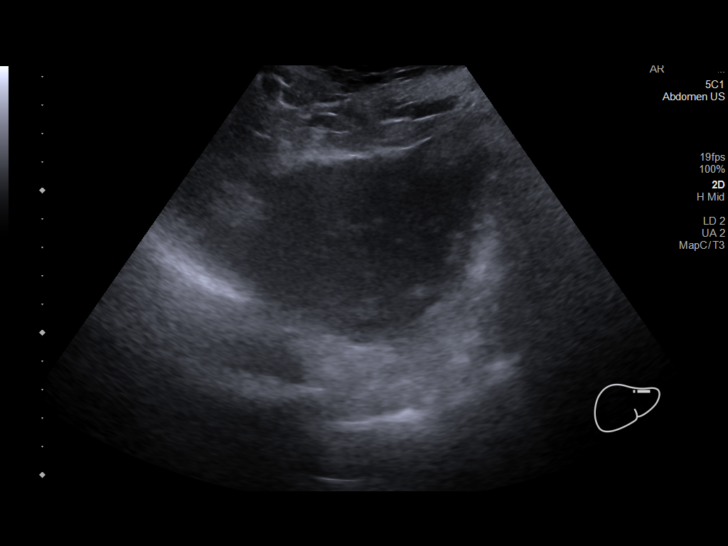
[im 31/46]
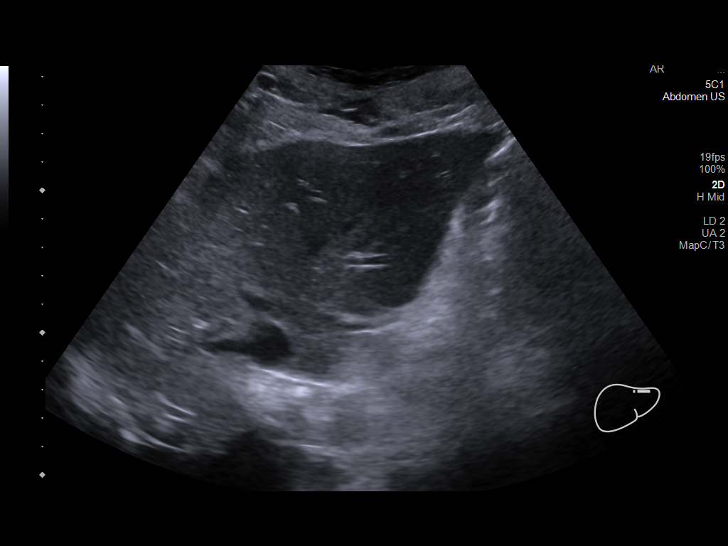
[im 34/46]
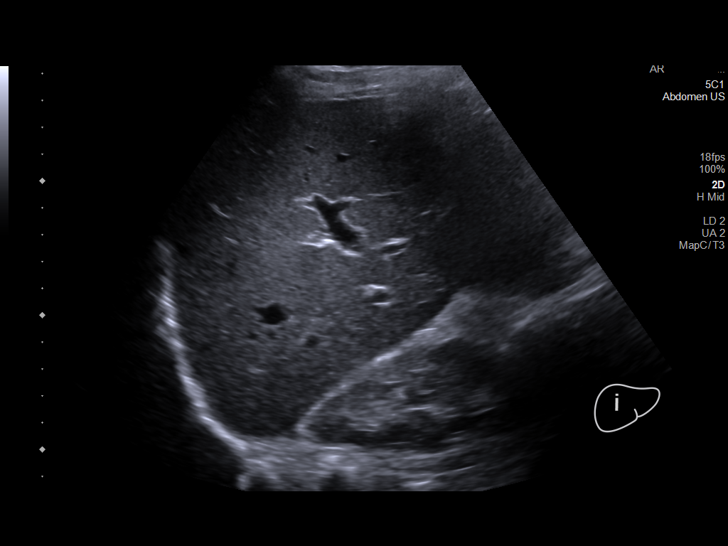
[im 38/46]
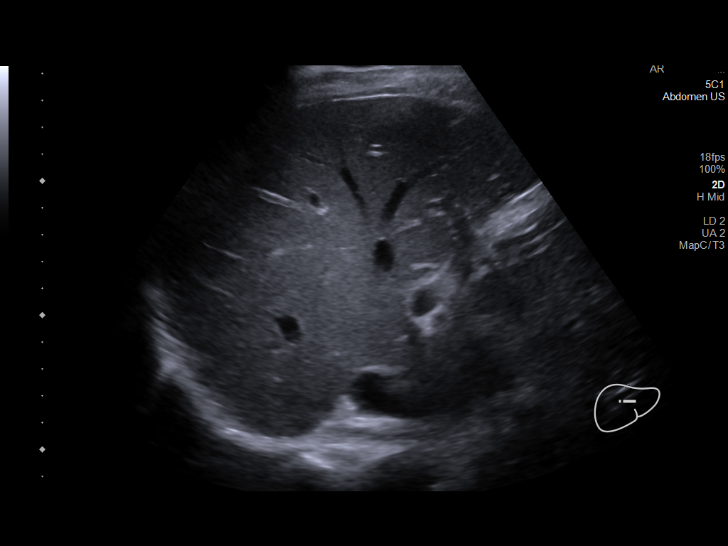
[im 42/46]
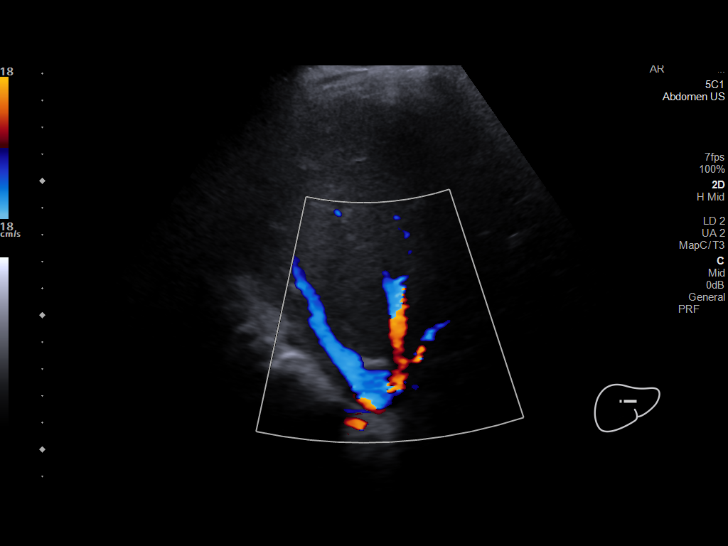
[im 46/46]
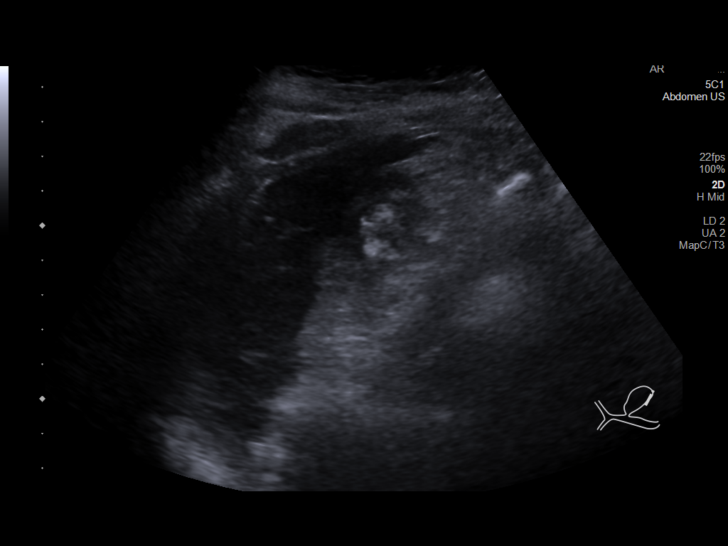

[14 of 25 positions shown; findings below may reference images not displayed]

FINDINGS: Gallbladder:

There are stones and sludge in the gallbladder with wall thickening
measuring 7 mm and a positive Murphy's sign.

Common bile duct:

Diameter: 4 mm

Liver:

No focal lesion identified. Within normal limits in parenchymal
echogenicity. Portal vein is patent on color Doppler imaging with
normal direction of blood flow towards the liver.

Other: None.
IMPRESSION: 1. Cholelithiasis and sludge in the gallbladder with wall thickening
and a positive Murphy's sign. The constellation of findings is
consistent with acute cholecystitis in the appropriate clinical
setting. If the clinical picture remains ambiguous, HIDA scan could
further evaluate.

These results will be called to the ordering clinician or
representative by the Radiologist Assistant, and communication
documented in the PACS or [REDACTED].

## 2021-04-29 ENCOUNTER — Ambulatory Visit (HOSPITAL_COMMUNITY)
Admission: EM | Admit: 2021-04-29 | Discharge: 2021-04-29 | Disposition: A | Discharge status: Home / Self Care | Payer: Medicaid Other | Attending: Psychiatry | Admitting: Psychiatry

## 2021-04-29 DIAGNOSIS — F1910 Other psychoactive substance abuse, uncomplicated: Secondary | ICD-10-CM | POA: Insufficient documentation

## 2021-04-29 DIAGNOSIS — F1190 Opioid use, unspecified, uncomplicated: Secondary | ICD-10-CM | POA: Insufficient documentation

## 2021-04-29 DIAGNOSIS — Z811 Family history of alcohol abuse and dependence: Secondary | ICD-10-CM | POA: Insufficient documentation

## 2021-04-29 DIAGNOSIS — F419 Anxiety disorder, unspecified: Secondary | ICD-10-CM | POA: Insufficient documentation

## 2021-04-29 DIAGNOSIS — Z814 Family history of other substance abuse and dependence: Secondary | ICD-10-CM | POA: Insufficient documentation

## 2021-04-29 NOTE — ED Triage Notes (Signed)
Pt presents to [facility] voluntarily requesting detox and substance use treatment. 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 Pt denies SI/HI and AVH. ?

## 2021-04-29 NOTE — ED Provider Notes (Signed)
Behavioral Health Urgent Care Medical Screening Exam ? ?Patient Name: Linda Spence ?MRN: [id number] ?Date of Evaluation: 04/29/21 ?Chief Complaint:   ?Diagnosis:  ?Final diagnoses:  ?Substance abuse ([place])  ? ? ?History of Present illness: Linda Spence is a 26 y.o. female. Patient presents voluntarily to [facility] behavioral health for walk-in assessment.  Patient is accompanied by her fiance, who does not remain present during assessment.  ? ?Cyndy presents today with request for substance use treatment resources. She is interested in outpatient treatment only at this time. She is willing to consider residential substance use treatment in two weeks time. She is enrolled in a ten-week long parenting course at this time, she has completed 8 classes and has only two remaining classes. ? 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  Patient reports she typically uses heroin or fentanyl every other day, sometimes daily.  She ingested substance by snorting it.  She reports 1 previous episode of sobriety when she was briefly placed in jail, 7 weeks sobriety total,  approximately 2 years ago.  She denies substance use aside from heroin and fentanyl. Denies alcohol use. ? ? She is not seen by outpatient psychiatry, no medication to address mood currently.  She has been diagnosed with anxiety in the past, was prescribed alprazolam at that time.  She denies history of inpatient psychiatric hospitalizations.  Family history includes patient's mother who has a history of substance use disorder in patient's father who has alcohol use disorder. ? ?Patient is assessed face-to-face by nurse practitioner.  She is seated in assessment area, no acute distress.  She is alert and oriented, pleasant and cooperative during assessment.  ?She presents with euthymic mood, congruent affect. She denies suicidal and homicidal ideations.  She denies history of suicide attempts, denies history of self-harm.  She contracts  verbally for safety with this Probation officer. ? ? She has normal speech and behavior. She denies both auditory and visual hallucinations.  Patient is able to converse coherently with goal-directed thoughts and no distractibility or preoccupation.  He denies paranoia.  Objectively there is no evidence of psychosis/mania or delusional thinking. ? ?Linda Spence resides in [place] with her fiance and 45-year-old son. She denies access to weapons. She is not currently employed. She denies substance use aside from heroin and fentanyl.Patient endorses average sleep and appetite. ? ?Patient offered support and encouragement. ? ? ?Psychiatric Specialty Exam ? ?Presentation  ?General Appearance:Appropriate for Environment; Casual ? ?Eye Contact:Good ? ?Speech:Clear and Coherent; Normal Rate ? ?Speech Volume:Normal ? ?Handedness:Right ? ? ?Mood and Affect  ?Mood:Euthymic ? ?Affect:Appropriate; Congruent ? ? ?Thought Process  ?Thought Processes:Coherent; Goal Directed; Linear ? ?Descriptions of Associations:Intact ? ?Orientation:Full (Time, Place and Person) ? ?Thought Content:Logical; WDL ?   Hallucinations:None ? ?Ideas of Reference:None ? ?Suicidal Thoughts:No ? ?Homicidal Thoughts:No ? ? ?Sensorium  ?Memory:Immediate Good; Recent Good ? ?Judgment:Good ? ?Insight:Fair ? ? ?Executive Functions  ?Concentration:Good ? ?Attention Span:Good ? ?Recall:Good ? ?Fund of [place] ? ?Language:Good ? ? ?Psychomotor Activity  ?Psychomotor Activity:Normal ? ? ?Assets  ?Assets:Communication Skills; Desire for Improvement; Financial Resources/Insurance; Housing; Intimacy; Leisure Time; Physical Health; Resilience; Social Support ? ? ?Sleep  ?Sleep:Fair ? ?Number of hours: No data recorded ? ?No data recorded ? ?Physical Exam: ?Physical Exam ?Vitals and nursing note reviewed.  ?Constitutional:   ?   Appearance: Normal appearance. She is well-developed and normal weight.  ?HENT:  ?   Head: Normocephalic and atraumatic.  ?   Nose: Nose normal.   ?Cardiovascular:  ?  Rate and Rhythm: Normal rate.  ?Pulmonary:  ?   Effort: Pulmonary effort is normal.  ?Musculoskeletal:     ?   General: Normal range of motion.  ?   Cervical back: Normal range of motion.  ?Skin: ?   General: Skin is warm and dry.  ?Neurological:  ?   Mental Status: She is alert and oriented to person, place, and time.  ?Psychiatric:     ?   Attention and Perception: Attention and perception normal.     ?   Mood and Affect: Mood and affect normal.     ?   Speech: Speech normal.     ?   Behavior: Behavior normal. Behavior is cooperative.     ?   Thought Content: Thought content normal.     ?   Cognition and Memory: Cognition and memory normal.     ?   Judgment: Judgment normal.  ? ?Review of Systems  ?Constitutional: Negative.   ?HENT: Negative.    ?Eyes: Negative.   ?Respiratory: Negative.    ?Cardiovascular: Negative.   ?Gastrointestinal: Negative.   ?Genitourinary: Negative.   ?Musculoskeletal: Negative.   ?Skin: Negative.   ?Neurological: Negative.   ?Endo/Heme/Allergies: Negative.   ?Psychiatric/Behavioral:  Positive for substance abuse.   ?Blood pressure 137/76, pulse 90, temperature 98.5 ?F (36.9 ?C), temperature source Oral, resp. rate 18, SpO2 96 %. There is no height or weight on file to calculate BMI. ? ?Musculoskeletal: ?Strength & Muscle Tone: within normal limits ?Gait & Station: normal ?Patient leans: N/A ? ? ?[facility] Discharge Disposition for Follow up and Recommendations: ?Based on my evaluation the patient does not appear to have an emergency medical condition and can be discharged with resources and follow up care in outpatient services for Medication Management, Substance Abuse Intensive Outpatient Program, and Individual Therapy ?Patient reviewed with Dr. Serafina Mitchell. ?Follow-up with outpatient psychiatry, resources provided. ?Follow-up with substance use treatment resources provided. ? ?Lucky Rathke, FNP ?04/29/2021, 6:03 PM ? ?

## 2021-04-29 NOTE — Discharge Instructions (Addendum)
To help you maintain a sober lifestyle, a substance use disorder treatment program may be beneficial to you.  [facility] offers a Chemical Dependency Intensive Outpatient Program.  Contact them at your earliest opportunity to ask about enrolling:  ? ?     [facility], [place] ?     [address] ?     [place], [place] [address] ?     [phone]  ? ?Patient is instructed prior to discharge to: ? Take all medications as prescribed by his/her mental healthcare provider. ?Report any adverse effects and or reactions from the medicines to his/her outpatient provider promptly. ?Keep all scheduled appointments, to ensure that you are getting refills on time and to avoid any interruption in your medication.  If you are unable to keep an appointment call to reschedule.  Be sure to follow-up with resources and follow-up appointments provided.  ?Patient has been instructed & cautioned: To not engage in alcohol and or illegal drug use while on prescription medicines. ?In the event of worsening symptoms, patient is instructed to call the crisis hotline, 911 and or go to the nearest ED for appropriate evaluation and treatment of symptoms. ?To follow-up with his/her primary care provider for your other medical issues, concerns and or health care needs. ?  ? ?Substance Abuse Treatment Resources listed Below: ? ?[facility] ?- Admissions are currently completed Monday through Friday at 8am; both appointments and walk-ins are accepted.  Any individual that is a [facility] resident may present for a substance abuse screening and assessment for admission.  A person may be referred by numerous sources or self-refer.   Potential clients will be screened for medical necessity and appropriateness for the program.  Clients must meet criteria for high-intensity residential treatment services.  If clinically appropriate, a client will continue with the comprehensive clinical  assessment and intake process, as well as enrollment in the [facility]. ? ?Address: [address] ?[place], [place] [address] ?Admin Hours: Mon-Fri 8AM to 5PM ?Center Hours: 24/7 ?Phone: [phone] ?Fax: [phone] ? ?[facility] [employer] ?Address: [address] [place], [place], [place] [address] ?Behavioral Health Urgent Care [facility]) ?Hours: 24/7 ?Phone: [phone] ?Fax: [phone] ? ?[facility] ([facility]): (offers outpatient therapy and intensive outpatient substance abuse therapy).  ?[address], [place], [place] [address] ?Phone: [phone] ? ?[facility] : Offers FREE recovery skills classes, support groups, 1:1 Peer Support, and Compeer Classes. ?[address] [place], [place] [address] ?Phone: [phone] (Call to complete intake).  ? ?[employer] [facility]Men's Division ?[address] ?[place], [place] [address] ?Phone: [phone] ext [phone] ?The [employer] provides food, shelter and other programs and services to the homeless men of [place] through our [employer]. ? ?By offering safe shelter, three meals a day, clean clothing, Biblical counseling, financial planning, vocational training, GED/education and employment assistance, we've helped mend the shattered lives of many homeless men since opening in 1974. ? ?We have approximately 267 beds available, with a max of 312 beds including mats for emergency situations and currently house an average of 270 men a night. ? ?Prospective Client Check-In Information ?Photo ID Required (State/ Out of State/ [facility]) - if photo ID is not available, clients are required to have a printout of a police/sheriff's criminal history report. ?Help out with chores around the [facility]. ?No sex offender of any type (pending, charged, registered and/or any other sex related offenses) will be  permitted to check in. ?Must be willing to abide by all rules, regulations, and policies established by  the [employer]. ?The following will be provided - shelter, food, clothing, and biblical counseling. ?If you or someone you know is in need of assistance at our [facility] shelter in [place], [place], please call [phone] ext. [phone]. ? ?[facility]-will provide timely access to mental health services for children and adolescents (4-17) and adults presenting in a mental health crisis. The program is designed for those who need urgent Behavioral Health or Substance Use treatment and are not experiencing a medical crisis that would typically require an emergency room visit.  ?  ?[address] ?[place], [place] [address] ?Phone: [phone] ?[email or web address] ? ?[facility]: Phone#: [phone] ? ?[facility] ?SA Intensive Outpatient Program (SAIOP) means structured individual and group addiction activities and services that are provided at an outpatient program designed to assist adult and adolescent consumers to begin recovery and learn skills for recovery maintenance. The [facility] [facility] SAIOP program is offered at least 3 hours a day, 3 days a week.SAIOP services shall include a structured program consisting of, but not limited to, the following services: ?Individual counseling and support; Group counseling and support; Family counseling, training or support; Biochemical assays to identify recent drug use (e.g., urine drug screens); Strategies for relapse prevention to include community and social support systems in treatment; Life skills; Crisis contingency planning; Disease Management; and Treatment support activities that have been adapted or specifically designed for persons with physical disabilities, or persons with co-occurring disorders of mental illness and substance abuse/dependence or mental retardation/developmental disability and substance abuse/dependence. ?Phone: [phone]  ?Address:  ? ?[facility] will also  offer the following outpatient services: (Monday through Friday 8am-5pm) ?  ?Partial Hospitalization Program (PHP) ?Substance Abuse Intensive Outpatient Program (SA-IOP) ?Group Therapy ?Medication Management ?Peer Living Room ?We also provide (24/7):  ?Assessments: Our mental health clinician and providers will conduct a focused mental health evaluation, assessing for immediate safety concerns and further mental health needs. ?Referral: Our team will provide resources and help connect to community based mental health treatment, when indicated, including psychotherapy, psychiatry, and other specialized behavioral health or substance use disorder services (for those not already in treatment). ?Transitional Care: Our team providers in person bridging and/or telephonic follow-up during the patient's transition to outpatient services.  ?[facility] ?24-Hour Call Center: ?[phone] ?Behavioral Health Crisis Line: ?[phone] ? ?

## 2021-04-29 NOTE — BH Assessment (Signed)
[facility] Assessment Progress Note ?  ?Per Doran Heater, NP, this pt does not require psychiatric hospitalization at this time.  Pt is psychiatrically cleared.  Discharge instructions include referral information for the Chemical Dependency Intensive Outpatient Program offered by [facility] in [place], pt's community of residence.  [name] has been notified. ? ?Doylene Canning, MA ?Triage Specialist ?[phone] ? ?

## 2022-01-27 ENCOUNTER — Encounter (HOSPITAL_COMMUNITY): Payer: Self-pay | Admitting: *Deleted

## 2022-01-27 ENCOUNTER — Other Ambulatory Visit: Payer: Self-pay

## 2022-01-27 ENCOUNTER — Emergency Department (HOSPITAL_COMMUNITY)
Admission: EM | Admit: 2022-01-27 | Discharge: 2022-01-27 | Disposition: A | Discharge status: Home / Self Care | Payer: Self-pay | Attending: Emergency Medicine | Admitting: Emergency Medicine

## 2022-01-27 ENCOUNTER — Emergency Department (HOSPITAL_COMMUNITY): Payer: Self-pay

## 2022-01-27 DIAGNOSIS — R000 Tachycardia, unspecified: Secondary | ICD-10-CM | POA: Insufficient documentation

## 2022-01-27 DIAGNOSIS — E876 Hypokalemia: Secondary | ICD-10-CM | POA: Insufficient documentation

## 2022-01-27 DIAGNOSIS — Z1152 Encounter for screening for COVID-19: Secondary | ICD-10-CM | POA: Insufficient documentation

## 2022-01-27 DIAGNOSIS — R059 Cough, unspecified: Secondary | ICD-10-CM | POA: Insufficient documentation

## 2022-01-27 DIAGNOSIS — R0789 Other chest pain: Secondary | ICD-10-CM | POA: Insufficient documentation

## 2022-01-27 LAB — CBC WITH DIFFERENTIAL/PLATELET
Abs Immature Granulocytes: 0.02 10*3/uL (ref 0.00–0.07)
Basophils Absolute: 0.1 10*3/uL (ref 0.0–0.1)
Basophils Relative: 1 %
Eosinophils Absolute: 0.5 10*3/uL (ref 0.0–0.5)
Eosinophils Relative: 5 %
HCT: 37.8 % (ref 36.0–46.0)
Hemoglobin: 13 g/dL (ref 12.0–15.0)
Immature Granulocytes: 0 %
Lymphocytes Relative: 43 %
Lymphs Abs: 4.5 10*3/uL — ABNORMAL HIGH (ref 0.7–4.0)
MCH: 28.4 pg (ref 26.0–34.0)
MCHC: 34.4 g/dL (ref 30.0–36.0)
MCV: 82.5 fL (ref 80.0–100.0)
Monocytes Absolute: 0.4 10*3/uL (ref 0.1–1.0)
Monocytes Relative: 4 %
Neutro Abs: 5.1 10*3/uL (ref 1.7–7.7)
Neutrophils Relative %: 47 %
Platelets: 287 10*3/uL (ref 150–400)
RBC: 4.58 MIL/uL (ref 3.87–5.11)
RDW: 13.1 % (ref 11.5–15.5)
WBC: 10.5 10*3/uL (ref 4.0–10.5)
nRBC: 0 % (ref 0.0–0.2)

## 2022-01-27 LAB — COMPREHENSIVE METABOLIC PANEL
ALT: 26 U/L (ref 0–44)
AST: 23 U/L (ref 15–41)
Albumin: 3.6 g/dL (ref 3.5–5.0)
Alkaline Phosphatase: 73 U/L (ref 38–126)
Anion gap: 9 (ref 5–15)
BUN: 7 mg/dL (ref 6–20)
CO2: 26 mmol/L (ref 22–32)
Calcium: 8.4 mg/dL — ABNORMAL LOW (ref 8.9–10.3)
Chloride: 102 mmol/L (ref 98–111)
Creatinine, Ser: 0.59 mg/dL (ref 0.44–1.00)
GFR, Estimated: >60 mL/min (ref >60)
Glucose, Bld: 117 mg/dL — ABNORMAL HIGH (ref 70–99)
Potassium: 3.1 mmol/L — ABNORMAL LOW (ref 3.5–5.1)
Sodium: 137 mmol/L (ref 135–145)
Total Bilirubin: 0.2 mg/dL — ABNORMAL LOW (ref 0.3–1.2)
Total Protein: 6.4 g/dL — ABNORMAL LOW (ref 6.5–8.1)

## 2022-01-27 LAB — RAPID URINE DRUG SCREEN, HOSP PERFORMED
Amphetamines: NOT DETECTED
Barbiturates: NOT DETECTED
Benzodiazepines: NOT DETECTED
Cocaine: NOT DETECTED
Opiates: NOT DETECTED
Tetrahydrocannabinol: POSITIVE — AB

## 2022-01-27 LAB — RESP PANEL BY RT-PCR (RSV, FLU A&B, COVID)  RVPGX2
Influenza A by PCR: NEGATIVE
Influenza B by PCR: NEGATIVE
Resp Syncytial Virus by PCR: NEGATIVE
SARS Coronavirus 2 by RT PCR: NEGATIVE

## 2022-01-27 LAB — D-DIMER, QUANTITATIVE: D-Dimer, Quant: 0.47 ug/mL-FEU (ref 0.00–0.50)

## 2022-01-27 LAB — I-STAT BETA HCG BLOOD, ED (MC, WL, AP ONLY): I-stat hCG, quantitative: <5 m[IU]/mL (ref <5)

## 2022-01-27 LAB — TROPONIN I (HIGH SENSITIVITY): Troponin I (High Sensitivity): <2 ng/L (ref <18)

## 2022-01-27 MED ORDER — METOPROLOL TARTRATE 25 MG PO TABS
12.5000 mg | ORAL_TABLET | Freq: Once | ORAL | Status: AC
  Administered 2022-01-27: 12.5 mg via ORAL
  Filled 2022-01-27: qty 1

## 2022-01-27 MED ORDER — KETOROLAC TROMETHAMINE 15 MG/ML IJ SOLN
15.0000 mg | Freq: Once | INTRAMUSCULAR | Status: AC
  Administered 2022-01-27: 15 mg via INTRAVENOUS

## 2022-01-27 MED ORDER — IPRATROPIUM-ALBUTEROL 0.5-2.5 (3) MG/3ML IN SOLN
3.0000 mL | Freq: Once | RESPIRATORY_TRACT | Status: AC
  Administered 2022-01-27: 3 mL via RESPIRATORY_TRACT
  Filled 2022-01-27: qty 3

## 2022-01-27 MED ORDER — SODIUM CHLORIDE 0.9 % IV BOLUS
1000.0000 mL | Freq: Once | INTRAVENOUS | Status: AC
  Administered 2022-01-27: 1000 mL via INTRAVENOUS

## 2022-01-27 MED ORDER — KETOROLAC TROMETHAMINE 30 MG/ML IJ SOLN
15.0000 mg | Freq: Once | INTRAMUSCULAR | Status: DC
  Filled 2022-01-27: qty 1

## 2022-01-27 MED ORDER — METOPROLOL SUCCINATE ER 25 MG PO TB24: 12.5000 mg | ORAL_TABLET | Freq: Every day | ORAL | 1 refills | Status: DC

## 2022-01-27 MED ORDER — POTASSIUM CHLORIDE CRYS ER 20 MEQ PO TBCR
40.0000 meq | EXTENDED_RELEASE_TABLET | Freq: Once | ORAL | Status: AC
  Administered 2022-01-27: 40 meq via ORAL
  Filled 2022-01-27: qty 2

## 2022-01-27 NOTE — Discharge Instructions (Signed)
Note the workup today was overall reassuring.  Low suspicion for heart attack, blood clot in the lung, pneumonia, collapsed lung.  Recommend taking NSAIDs in the form of ibuprofen for said pain.  Recommend follow-up with cardiology as soon as you are able for reassessment.  Please do not hesitate to return to emergency department for worrisome signs and symptoms we discussed become apparent.

## 2022-01-27 NOTE — ED Triage Notes (Signed)
Chronic chest pain increased in the last 2 weeks.  Pt has cough and cold, pain increases with movement.

## 2022-01-27 NOTE — ED Provider Notes (Signed)
[facility] Provider Note   CSN: [id number] Arrival date & time: 01/27/22  1517     History  Chief Complaint  Patient presents with   Chest Pain    Linda Spence is a 27 y.o. female.   Chest Pain   27 year old female presents emergency department with complaints of chest pain.  Reports chest pain is left-sided in nature and worsened with cough, exertion, laying on affected side.  States the pain has been present for the past 2 years with acute worsening over the past few months.  States that she is felt shortness of breath that is "chronic."  Reports history of polysubstance abuse but states she has not used drugs in the past few months.  Reports associated cough.  Denies fever, abdominal pain, nausea, vomiting, urinary/vaginal symptoms, change in bowel habits.  Denies history of DVT/PE, recent surgery/anticoagulation, known malignancy.  Patient states that she used to be on metoprolol for fast heart rate but has been unable to afford it secondary to insurance issues.  Has not been taking it for the past several months.  Past medical history significant for polysubstance abuse, lupus, autoimmune diabetes, hepatitis C, asthma  Home Medications Prior to Admission medications   Medication Sig Start Date End Date Taking? Authorizing Provider  metoprolol succinate (TOPROL-XL) 25 MG 24 hr tablet Take 0.5 tablets (12.5 mg total) by mouth at bedtime. 01/27/22  Yes Dion Saucier A, PA  dicyclomine (BENTYL) 20 MG tablet Take 1 tablet (20 mg total) by mouth every 6 (six) hours as needed for spasms (abdominal cramping). Patient not taking: Reported on 06/30/2019 05/08/17   Francine Graven, DO  ibuprofen (ADVIL) 600 MG tablet Take 1 tablet (600 mg total) by mouth every 6 (six) hours as needed. Patient not taking: Reported on 01/27/2022 07/02/19   Kathie Dike, MD  omeprazole (PRILOSEC) 40 MG capsule Take 1 capsule (40 mg total) by mouth daily as  needed. Patient not taking: Reported on 01/27/2022 07/02/19   Kathie Dike, MD      Allergies    Amoxicillin, Onion, Tylenol [acetaminophen], and Doxycycline    Review of Systems   Review of Systems  Cardiovascular:  Positive for chest pain.  All other systems reviewed and are negative.   Physical Exam Updated Vital Signs BP 131/84   Pulse (!) 108   Resp 20   LMP 01/17/2022   SpO2 100%  Physical Exam Vitals and nursing note reviewed.  Constitutional:      General: She is not in acute distress.    Appearance: She is well-developed.  HENT:     Head: Normocephalic and atraumatic.  Eyes:     Conjunctiva/sclera: Conjunctivae normal.  Cardiovascular:     Rate and Rhythm: Normal rate and regular rhythm.     Heart sounds: No murmur heard. Pulmonary:     Effort: Pulmonary effort is normal. No respiratory distress.     Comments: Mild wheeze auscultated on respiratory exam. Abdominal:     Palpations: Abdomen is soft.     Tenderness: There is no abdominal tenderness.  Musculoskeletal:        General: No swelling.     Cervical back: Neck supple.     Right lower leg: No edema.     Left lower leg: No edema.  Skin:    General: Skin is warm and dry.     Capillary Refill: Capillary refill takes less than 2 seconds.     Comments: Unable to assess  for splinter hemorrhages given patient's fingernails patient entirely black.  No Osler nodes, Janeway lesions appreciated  Neurological:     Mental Status: She is alert.  Psychiatric:        Mood and Affect: Mood normal.     ED Results / Procedures / Treatments   Labs (all labs ordered are listed, but only abnormal results are displayed) Labs Reviewed  COMPREHENSIVE METABOLIC PANEL - Abnormal; Notable for the following components:      Result Value   Potassium 3.1 (*)    Glucose, Bld 117 (*)    Calcium 8.4 (*)    Total Protein 6.4 (*)    Total Bilirubin 0.2 (*)    All other components within normal limits  CBC WITH  DIFFERENTIAL/PLATELET - Abnormal; Notable for the following components:   Lymphs Abs 4.5 (*)    All other components within normal limits  RAPID URINE DRUG SCREEN, HOSP PERFORMED - Abnormal; Notable for the following components:   Tetrahydrocannabinol POSITIVE (*)    All other components within normal limits  RESP PANEL BY RT-PCR (RSV, FLU A&B, COVID)  RVPGX2  D-DIMER, QUANTITATIVE  I-STAT BETA HCG BLOOD, ED ([facility], [facility], [facility] ONLY)  TROPONIN I (HIGH SENSITIVITY)    EKG EKG Interpretation  Date/Time:  Thursday January 27 2022 09:33:18 EST Ventricular Rate:  99 PR Interval:  132 QRS Duration: 105 QT Interval:  354 QTC Calculation: 455 R Axis:   71 Text Interpretation: Sinus rhythm RSR' in V1 or V2, right VCD or RVH since last tracing no significant change Confirmed by Noemi Chapel [phone]) on 01/27/2022 9:35:51 AM  Radiology DG Chest 2 View  Result Date: 01/27/2022 CLINICAL DATA:  Chest pain EXAM: CHEST - 2 VIEW COMPARISON:  07/13/2008 FINDINGS: The heart size and mediastinal contours are within normal limits. Both lungs are clear. The visualized skeletal structures are unremarkable. IMPRESSION: No active cardiopulmonary disease. Electronically Signed   By: Kathreen Devoid M.D.   On: 01/27/2022 10:40    Procedures Procedures    Medications Ordered in ED Medications  ipratropium-albuterol (DUONEB) 0.5-2.5 (3) MG/3ML nebulizer solution 3 mL (3 mLs Nebulization Given 01/27/22 0956)  metoprolol tartrate (LOPRESSOR) tablet 12.5 mg (12.5 mg Oral Given 01/27/22 1005)  ketorolac (TORADOL) 15 MG/ML injection 15 mg (15 mg Intravenous Given 01/27/22 1012)  sodium chloride 0.9 % bolus 1,000 mL (0 mLs Intravenous Stopped 01/27/22 1135)  potassium chloride SA (KLOR-CON M) CR tablet 40 mEq (40 mEq Oral Given 01/27/22 1135)    ED Course/ Medical Decision Making/ A&P Clinical Course as of 01/27/22 1200  Thu Jan 27, 2022  1107 DG Chest 2 View [CR]    Clinical Course User Index [CR] Wilnette Kales, PA                              Medical Decision Making Amount and/or Complexity of Data Reviewed Labs: ordered. Radiology: ordered. Decision-making details documented in ED Course.  Risk Prescription drug management.   This patient presents to the ED for concern of chest pain, this involves an extensive number of treatment options, and is a complaint that carries with it a high risk of complications and morbidity.  The differential diagnosis includes ACS, PE, pneumothorax, pneumonia, aortic dissection, MSK, tamponade, peritonitis/myocarditis   Co morbidities that complicate the patient evaluation  See HPI   Additional history obtained:  Additional history obtained from EMR External records from outside source obtained and reviewed including hospital  records   Lab Tests:  I Ordered, and personally interpreted labs.  The pertinent results include: No leukocytosis noted.  No evidence of anemia.  Platelets within normal range.  Mild hypokalemia with potassium of 3.1 of which is supplemented orally.  No transaminitis.  No renal dysfunction.  UDS positive for THC.  Respiratory viral panel negative.  Initial troponin of less than 2; EKG concerning for sinus rhythm without acute ischemic changes from prior EKGs performed.  Negative D-dimer   Imaging Studies ordered:  I ordered imaging studies including chest x-ray I independently visualized and interpreted imaging which showed no acute cardiopulmonary abnormalities I agree with the radiologist interpretation   Cardiac Monitoring: / EKG:  The patient was maintained on a cardiac monitor.  I personally viewed and interpreted the cardiac monitored which showed an underlying rhythm of: Sinus tachycardia without acute ischemic changes from prior EKGs performed   Consultations Obtained:  N/a   Problem List / ED Course / Critical interventions / Medication management  Chest pain I ordered medication including Toradol, Lopressor,  DuoNeb, potassium chloride   Reevaluation of the patient after these medicines showed that the patient improved I have reviewed the patients home medicines and have made adjustments as needed   Social Determinants of Health:  Chronic THC use.  History of polysubstance abuse although not currently using.   Test / Admission - Considered:  Atypical chest pain Vitals signs significant for persistently tachycardic with a heart rate greater than 108.. Otherwise within normal range and stable throughout visit. Laboratory/imaging studies significant for: See above patient with overall reassuring workup.  Doubt ACS given negative troponin, lack of EKG changes.  Heart score 03 without second troponin very low suspicion for ACS.  Negative D-dimer so doubt PE.  Doubt pneumonia, dissection, pneumothorax.  Patient seems like chest pain is more chronic in nature.  No improvement of symptoms with DuoNeb as well as NSAID administered.  Recommended continued outpatient treatment of the same.  Recommend follow-up with cardiology for reassessment given that she has not been back in a few years.  Treatment plan discussed with the patient and she acknowledged understanding was agreeable to said plan. Worrisome signs and symptoms were discussed with the patient, and the patient acknowledged understanding to return to the ED if noticed. Patient was stable upon discharge.          Final Clinical Impression(s) / ED Diagnoses Final diagnoses:  Atypical chest pain    Rx / DC Orders ED Discharge Orders          Ordered    Ambulatory referral to Cardiology       Comments: If you have not heard from the Cardiology office within the next 72 hours please call [phone].   01/27/22 1129    metoprolol succinate (TOPROL-XL) 25 MG 24 hr tablet  Daily at bedtime        01/27/22 1140              Peter Garter, [place] 01/27/22 1200    Eber Hong, MD 01/28/22 [phone]

## 2022-02-08 ENCOUNTER — Ambulatory Visit: Payer: Self-pay | Admitting: Internal Medicine

## 2022-02-17 ENCOUNTER — Other Ambulatory Visit: Payer: Self-pay | Admitting: Internal Medicine

## 2022-02-17 ENCOUNTER — Encounter: Payer: Self-pay | Admitting: Internal Medicine

## 2022-02-17 ENCOUNTER — Ambulatory Visit: Payer: Self-pay | Attending: Internal Medicine | Admitting: Internal Medicine

## 2022-02-17 ENCOUNTER — Telehealth: Payer: Self-pay | Admitting: Internal Medicine

## 2022-02-17 ENCOUNTER — Ambulatory Visit (INDEPENDENT_AMBULATORY_CARE_PROVIDER_SITE_OTHER): Payer: Self-pay

## 2022-02-17 VITALS — BP 124/78 | HR 68 | Ht 63.0 in | Wt 162.6 lb

## 2022-02-17 DIAGNOSIS — R002 Palpitations: Secondary | ICD-10-CM

## 2022-02-17 DIAGNOSIS — R079 Chest pain, unspecified: Secondary | ICD-10-CM | POA: Insufficient documentation

## 2022-02-17 DIAGNOSIS — R252 Cramp and spasm: Secondary | ICD-10-CM

## 2022-02-17 DIAGNOSIS — I739 Peripheral vascular disease, unspecified: Secondary | ICD-10-CM

## 2022-02-17 DIAGNOSIS — Z8249 Family history of ischemic heart disease and other diseases of the circulatory system: Secondary | ICD-10-CM

## 2022-02-17 MED ORDER — METOPROLOL TARTRATE 100 MG PO TABS: 100.0000 mg | ORAL_TABLET | Freq: Once | ORAL | 0 refills | Status: DC

## 2022-02-17 NOTE — Telephone Encounter (Signed)
2 D Echo dx: chest pain ABI's  dx: leg cramps 7 Day ZIO XT dx: palpitations  PERCERT

## 2022-02-17 NOTE — Patient Instructions (Addendum)
Medication Instructions:  Your physician recommends that you continue on your current medications as directed. Please refer to the Current Medication list given to you today.  Labwork: none  Testing/Procedures: Your physician has requested that you have an echocardiogram. Echocardiography is a painless test that uses sound waves to create images of your heart. It provides your doctor with information about the size and shape of your heart and how well your heart's chambers and valves are working. This procedure takes approximately one hour. There are no restrictions for this procedure. Please do NOT wear cologne, perfume, aftershave, or lotions (deodorant is allowed). Please arrive 15 minutes prior to your appointment time. Your physician has requested that you have an ankle brachial index (ABI). During this test an ultrasound and blood pressure cuff are used to evaluate the arteries that supply the arms and legs with blood. Allow thirty minutes for this exam. There are no restrictions or special instructions. Coronary CTA-see instructions below Your physician has recommended that you wear a Zio monitor.   This monitor is a medical device that records the heart's electrical activity. Doctors most often use these monitors to diagnose arrhythmias. Arrhythmias are problems with the speed or rhythm of the heartbeat. The monitor is a small device applied to your chest. You can wear one while you do your normal daily activities. While wearing this monitor if you have any symptoms to push the button and record what you felt. Once you have worn this monitor for the period of time provider prescribed (for 7 days), you will return the monitor device in the postage paid box. Once it is returned they will download the data collected and provide [place] with a report which the provider will then review and we will call you with those results. Important tips:  Avoid showering during the first 24 hours of wearing the  monitor. Avoid excessive sweating to help maximize wear time. Do not submerge the device, no hot tubs, and no swimming pools. Keep any lotions or oils away from the patch. After 24 hours you may shower with the patch on. Take brief showers with your back facing the shower head.  Do not remove patch once it has been placed because that will interrupt data and decrease adhesive wear time. Push the button when you have any symptoms and write down what you were feeling. Once you have completed wearing your monitor, remove and place into box which has postage paid and place in your outgoing mailbox.  If for some reason you have misplaced your box then call our office and we can provide another box and/or mail it off for you.  Follow-Up: Your physician recommends that you schedule a follow-up appointment in: 6 months  Any Other Special Instructions Will Be Listed Below (If Applicable).  If you need a refill on your cardiac medications before your next appointment, please call your pharmacy.    Your cardiac CT will be scheduled at one of the below locations:   [facility] [address] [place], [place] [address] [phone]  If scheduled at [facility], please arrive at the [facility] and Children's Entrance (Entrance C2) of [facility] 30 minutes prior to test start time. You can use the FREE valet parking offered at entrance C (encouraged to control the heart rate for the test)  Proceed to the [facility] Radiology Department (first floor) to check-in and test prep.  All radiology patients and guests should use entrance C2 at [facility], accessed from [facility], even though the hospital's physical address listed is [address]    Please follow these instructions carefully (unless otherwise directed):   On the Night Before the Test: Be sure to Drink plenty of water. Do not consume any caffeinated/decaffeinated beverages  or chocolate 12 hours prior to your test. Do not take any antihistamines 12 hours prior to your test. If the patient has contrast allergy: No contrast allergy On the Day of the Test: Drink plenty of water until 1 hour prior to the test. Do not eat any food 1 hour prior to test. You may take your regular medications prior to the test except metoprolol succinate  Take metoprolol (Lopressor) 100 mg two hours prior to test. FEMALES- please wear underwire-free bra if available, avoid dresses & tight clothing      After the Test: Drink plenty of water. After receiving IV contrast, you may experience a mild flushed feeling. This is normal. On occasion, you may experience a mild rash up to 24 hours after the test. This is not dangerous. If this occurs, you can take Benadryl 25 mg and increase your fluid intake. If you experience trouble breathing, this can be serious. If it is severe call 911 IMMEDIATELY. If it is mild, please call our office.  We will call to schedule your test 2-4 weeks out understanding that some insurance companies will need an authorization prior to the service being performed.   For non-scheduling related questions, please contact the cardiac imaging nurse navigator should you have any questions/concerns: Marchia Bond, Cardiac Imaging Nurse Navigator Gordy Clement, Cardiac Imaging Nurse Navigator [facility] Direct Office Dial: [phone]   For scheduling needs, including cancellations and rescheduling, please call [place], [phone].

## 2022-02-17 NOTE — Progress Notes (Signed)
Cardiology Office Note  Date: 02/17/2022   ID: Linda Spence, DOB 09/10/1995, MRN [id number]  PCP:  Redmond School, MD  Cardiologist:  Chalmers Guest, MD Electrophysiologist:  None   Reason for Office Visit: Chest pain evaluation the request of Unk Lightning, PA-C   History of Present Illness: Linda Spence is a 27 y.o. female known to have autoimmune condition, lupus per patient on Plaquenil, nicotine abuse was referred to cardiology clinic for evaluation of chest pain.  Patient has been having chest pain for the last 1 year but more so worsening in the last 6 months, occurring almost every day, occurs with rest/exertion/sleeping on the left side/taking a deep breath, lasting for 20 to 30 minutes and sometimes the whole day requiring ER visit in 01/2022. Denies any DOE but has ongoing palpitations kind of every day.  Denies dizziness, syncope, leg swelling.  Reported having cramps in her lower extremities on walking and gets better when she stops. Has been smoking cigarettes since she was 27 years old, smokes 1 and half pack per day currently cut down to 6 to 8 cigarettes/day.  She does have a family history of premature ASCVD, her maternal grandmother had stents starting from 82 years old and passed away at the age of 74.  Her biological mother did not have any history of MI/PCI/CABG.  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[place])    Bicornate uterus    Chronic pelvic pain in female    Depression    Hepatitis C    IBS (irritable bowel syndrome)    Lupus ([place])     Past Surgical History:  Procedure Laterality Date   PERINEAL LACERATION REPAIR  09/23/2011   Procedure: SUTURE REPAIR PERINEAL LACERATION;  Surgeon: Jonnie Kind, MD;  Location: [facility];  Service: Gynecology;  Laterality: N/A;  Repair Obstetric Laceration   TONSILLECTOMY     UTERINE SEPTUM RESECTION  08/2010   [facility]  2 cervices, nl ut ext contours   VAGINAL  SEPTUM RESECTION  06/15/2010   [name], normal uterine contour external    Current Outpatient Medications  Medication Sig Dispense Refill   hydroxychloroquine (PLAQUENIL) 200 MG tablet Take 200 mg by mouth daily.     ibuprofen (ADVIL) 600 MG tablet Take 1 tablet (600 mg total) by mouth every 6 (six) hours as needed. 30 tablet 0   metoprolol succinate (TOPROL-XL) 25 MG 24 hr tablet Take 0.5 tablets (12.5 mg total) by mouth at bedtime. 30 tablet 1   metoprolol tartrate (LOPRESSOR) 100 MG tablet Take 1 tablet (100 mg total) by mouth once for 1 dose. 2 hours before CT 1 tablet 0   omeprazole (PRILOSEC) 40 MG capsule Take 1 capsule (40 mg total) by mouth daily as needed. 30 capsule 0   No current facility-administered medications for this visit.   Allergies:  Amoxicillin, Onion, Tylenol [acetaminophen], and Doxycycline   Social History: The patient  reports that she has been smoking cigarettes. She started smoking about 18 years ago. She has a 2.50 pack-year smoking history. She has never used smokeless tobacco. She reports that she does not drink alcohol and does not use drugs.   Family History: The patient's family history includes Autism in her brother; Colon cancer in her paternal grandmother; Dermatomyositis in her mother; Epilepsy in her brother; Lupus in her sister.   ROS:  Please see the history of present illness.  Otherwise, complete review of systems is positive for none.  All other systems are reviewed and negative.   Physical Exam: VS:  BP 124/78   Pulse 68   Ht 5' 3"$  (1.6 m)   Wt 162 lb 9.6 oz (73.8 kg)   LMP 01/17/2022   SpO2 96%   BMI 28.80 kg/m , BMI Body mass index is 28.8 kg/m.  Wt Readings from Last 3 Encounters:  02/17/22 162 lb 9.6 oz (73.8 kg)  06/30/19 157 lb 3 oz (71.3 kg)  07/12/17 135 lb (61.2 kg)    General: Patient appears comfortable at rest. HEENT: Conjunctiva and lids normal, oropharynx clear with moist mucosa. Neck: Supple, no  elevated JVP or carotid bruits, no thyromegaly. Lungs: Clear to auscultation, nonlabored breathing at rest. Cardiac: Regular rate and rhythm, no S3 or significant systolic murmur, no pericardial rub. Abdomen: Soft, nontender, no hepatomegaly, bowel sounds present, no guarding or rebound. Extremities: No pitting edema Skin: Warm and dry. Musculoskeletal: No kyphosis. Neuropsychiatric: Alert and oriented x3, affect grossly appropriate.  ECG:  An ECG dated 01/28/22 was personally reviewed today and demonstrated:  Normal sinus rhythm  Recent Labwork: 01/27/2022: ALT 26; AST 23; BUN 7; Creatinine, Ser 0.59; Hemoglobin 13.0; Platelets 287; Potassium 3.1; Sodium 137  No results found for: "CHOL", "TRIG", "HDL", "CHOLHDL", "VLDL", "LDLCALC", "LDLDIRECT"  Other Studies Reviewed Today:   Assessment and Plan: Patient is a 27 year old F known to have autoimmune condition, lupus per patient on Plaquenil, nicotine abuse was referred to cardiology clinic for evaluation of chest pain.   # Chest pain # Family history of premature CAD (Maternal grandmother had PCI at the age of 49 and died at 29) -Obtain CTA cardiac -Obtain 2D echo cardiogram  # Palpitations -Obtain 1 week event monitor, non-live  # Lower EXTR claudication, rule out PAD -Obtain ABI with ultrasound arterial of lower lower extremities  # Nicotine abuse -Smoking cessation counseling provided.  Currently cutting down cigarettes. Smoking cessation instruction/counseling given:  counseled patient on the dangers of tobacco use, advised patient to stop smoking, and reviewed strategies to maximize success   I have spent a total of 45 minutes with patient reviewing chart, EKGs, labs and examining patient as well as establishing an assessment and plan that was discussed with the patient.  > 50% of time was spent in direct patient care.      Medication Adjustments/Labs and Tests Ordered: Current medicines are reviewed at length with the  patient today.  Concerns regarding medicines are outlined above.   Tests Ordered: Orders Placed This Encounter  Procedures   CT CORONARY MORPH W/CTA COR W/SCORE W/CA W/CM &/OR WO/CM   ECHOCARDIOGRAM COMPLETE   VAS [place] ABI WITH/WO TBI    Medication Changes: Meds ordered this encounter  Medications   metoprolol tartrate (LOPRESSOR) 100 MG tablet    Sig: Take 1 tablet (100 mg total) by mouth once for 1 dose. 2 hours before CT    Dispense:  1 tablet    Refill:  0    Disposition:  Follow up  6 months  Signed Anielle Headrick Fidel Levy, MD, 02/17/2022 11:45 AM    [facility] [place], [place], [place] [address]

## 2022-02-23 ENCOUNTER — Telehealth (HOSPITAL_COMMUNITY): Payer: Self-pay | Admitting: Emergency Medicine

## 2022-02-23 NOTE — Telephone Encounter (Signed)
Reaching out to patient to offer assistance regarding upcoming cardiac imaging study; pt verbalizes understanding of appt date/time, parking situation and where to check in, pre-test NPO status and medications ordered, and verified current allergies; name and call back number provided for further questions should they arise Marchia Bond RN Navigator Cardiac Imaging [facility] [phone] office [phone] cell  Arrival 200 WC Entrance Denies iv issues 153m metoprolol tartrate  Aware contrast/nitro

## 2022-02-24 ENCOUNTER — Ambulatory Visit (HOSPITAL_COMMUNITY): Payer: Self-pay

## 2022-02-25 ENCOUNTER — Ambulatory Visit (HOSPITAL_COMMUNITY)
Admission: RE | Admit: 2022-02-25 | Discharge: 2022-02-25 | Disposition: A | Discharge status: Home / Self Care | Payer: Self-pay | Source: Ambulatory Visit | Attending: Internal Medicine | Admitting: Internal Medicine

## 2022-02-25 DIAGNOSIS — R079 Chest pain, unspecified: Secondary | ICD-10-CM | POA: Insufficient documentation

## 2022-02-25 MED ORDER — NITROGLYCERIN 0.4 MG SL SUBL
SUBLINGUAL_TABLET | SUBLINGUAL | Status: AC
  Filled 2022-02-25: qty 2

## 2022-02-25 MED ORDER — NITROGLYCERIN 0.4 MG SL SUBL
0.8000 mg | SUBLINGUAL_TABLET | Freq: Once | SUBLINGUAL | Status: AC
  Administered 2022-02-25: 0.8 mg via SUBLINGUAL

## 2022-03-08 ENCOUNTER — Other Ambulatory Visit: Payer: Self-pay

## 2022-03-10 ENCOUNTER — Encounter: Payer: Self-pay | Admitting: Internal Medicine

## 2022-03-24 MED ORDER — IOHEXOL 350 MG/ML SOLN
100.0000 mL | Freq: Once | INTRAVENOUS | Status: AC | PRN
  Administered 2022-02-25: 100 mL via INTRAVENOUS

## 2022-03-24 NOTE — Addendum Note (Signed)
Encounter addended by: Maxie Barb, RT-CT on: 03/24/2022 8:31 AM  Actions taken: Imaging Exam ended, Order list changed, MAR administration accepted

## 2022-04-04 ENCOUNTER — Other Ambulatory Visit: Payer: Self-pay

## 2022-04-28 ENCOUNTER — Other Ambulatory Visit: Payer: Self-pay

## 2022-06-24 ENCOUNTER — Telehealth: Payer: Self-pay

## 2022-06-24 MED ORDER — METOPROLOL SUCCINATE ER 25 MG PO TB24: 25.0000 mg | ORAL_TABLET | Freq: Two times a day (BID) | ORAL | 3 refills | Status: AC

## 2022-06-24 NOTE — Telephone Encounter (Signed)
Patient notified

## 2022-06-24 NOTE — Telephone Encounter (Signed)
-----   Message from Marjo Bicker, MD sent at 06/21/2022  9:55 AM EDT ----- Increase to Metoprolol tartarate 25 mg BID. Hold if BP<90 mm Hg.  ----- Message ----- From: Roseanne Reno, CMA Sent: 06/21/2022   8:49 AM EDT To: Marjo Bicker, MD  Patient notified and verbalized understanding. Patient stated that she is still currently having palpitations that seem to last all day. Please advise.

## 2022-07-11 ENCOUNTER — Encounter: Payer: Self-pay | Admitting: Internal Medicine

## 2022-07-11 NOTE — Progress Notes (Signed)
Erroneous encounter - please disregard.
